# Patient Record
Sex: Female | Born: 1990
Health system: Southern US, Community
[De-identification: ages and names within clinical notes are randomized; demographics above are authoritative.]

## PROBLEM LIST (undated history)

## (undated) DIAGNOSIS — Z87442 Personal history of urinary calculi: Secondary | ICD-10-CM

## (undated) HISTORY — PX: OTHER SURGICAL HISTORY: SHX169

## (undated) HISTORY — DX: Personal history of urinary calculi: Z87.442

---

## 2009-04-11 DIAGNOSIS — Z87442 Personal history of urinary calculi: Secondary | ICD-10-CM

## 2009-04-11 HISTORY — DX: Personal history of urinary calculi: Z87.442

## 2013-05-30 ENCOUNTER — Ambulatory Visit (INDEPENDENT_AMBULATORY_CARE_PROVIDER_SITE_OTHER): Payer: 59 | Admitting: Nurse Practitioner

## 2013-05-30 ENCOUNTER — Encounter: Payer: Self-pay | Admitting: Nurse Practitioner

## 2013-05-30 VITALS — BP 134/82 | HR 80 | Ht 69.0 in | Wt 166.0 lb

## 2013-05-30 DIAGNOSIS — Z0000 Encounter for general adult medical examination without abnormal findings: Secondary | ICD-10-CM

## 2013-05-30 DIAGNOSIS — Z113 Encounter for screening for infections with a predominantly sexual mode of transmission: Secondary | ICD-10-CM

## 2013-05-30 DIAGNOSIS — Z01419 Encounter for gynecological examination (general) (routine) without abnormal findings: Secondary | ICD-10-CM

## 2013-05-30 MED ORDER — NORETHIN ACE-ETH ESTRAD-FE 1.5-30 MG-MCG PO TABS: 1.0000 | ORAL_TABLET | Freq: Every day | ORAL | Status: DC

## 2013-05-30 NOTE — Patient Instructions (Signed)
General topics  Next pap or exam is  due in 1 year Take a Women's multivitamin Take 1200 mg. of calcium daily - prefer dietary If any concerns in interim to call back  Breast Self-Awareness Practicing breast self-awareness may pick up problems early, prevent significant medical complications, and possibly save your life. By practicing breast self-awareness, you can become familiar with how your breasts look and feel and if your breasts are changing. This allows you to notice changes early. It can also offer you some reassurance that your breast health is good. One way to learn what is normal for your breasts and whether your breasts are changing is to do a breast self-exam. If you find a lump or something that was not present in the past, it is best to contact your caregiver right away. Other findings that should be evaluated by your caregiver include nipple discharge, especially if it is bloody; skin changes or reddening; areas where the skin seems to be pulled in (retracted); or new lumps and bumps. Breast pain is seldom associated with cancer (malignancy), but should also be evaluated by a caregiver. BREAST SELF-EXAM The best time to examine your breasts is 5 7 days after your menstrual period is over.  ExitCare Patient Information 2013 ExitCare, LLC.   Exercise to Stay Healthy Exercise helps you become and stay healthy. EXERCISE IDEAS AND TIPS Choose exercises that:  You enjoy.  Fit into your day. You do not need to exercise really hard to be healthy. You can do exercises at a slow or medium level and stay healthy. You can:  Stretch before and after working out.  Try yoga, Pilates, or tai chi.  Lift weights.  Walk fast, swim, jog, run, climb stairs, bicycle, dance, or rollerskate.  Take aerobic classes. Exercises that burn about 150 calories:  Running 1  miles in 15 minutes.  Playing volleyball for 45 to 60 minutes.  Washing and waxing a car for 45 to 60  minutes.  Playing touch football for 45 minutes.  Walking 1  miles in 35 minutes.  Pushing a stroller 1  miles in 30 minutes.  Playing basketball for 30 minutes.  Raking leaves for 30 minutes.  Bicycling 5 miles in 30 minutes.  Walking 2 miles in 30 minutes.  Dancing for 30 minutes.  Shoveling snow for 15 minutes.  Swimming laps for 20 minutes.  Walking up stairs for 15 minutes.  Bicycling 4 miles in 15 minutes.  Gardening for 30 to 45 minutes.  Jumping rope for 15 minutes.  Washing windows or floors for 45 to 60 minutes. Document Released: 04/30/2010 Document Revised: 06/20/2011 Document Reviewed: 04/30/2010 ExitCare Patient Information 2013 ExitCare[employer] LLC.   Other topics ( that may be useful information):    Sexually Transmitted Disease Sexually transmitted disease (STD) refers to any infection that is passed from person to person during sexual activity. This may happen by way of saliva, semen, blood, vaginal mucus, or urine. Common STDs include:  Gonorrhea.  Chlamydia.  Syphilis.  HIV/AIDS.  Genital herpes.  Hepatitis B and C.  Trichomonas.  Human papillomavirus (HPV).  Pubic lice. CAUSES  An STD may be spread by bacteria, virus, or parasite. A person can get an STD by:  Sexual intercourse with an infected person.  Sharing sex toys with an infected person.  Sharing needles with an infected person.  Having intimate contact with the genitals, mouth, or rectal areas of an infected person. SYMPTOMS  Some people may not have any symptoms, but   they can still pass the infection to others. Different STDs have different symptoms. Symptoms include:  Painful or bloody urination.  Pain in the pelvis, abdomen, vagina, anus, throat, or eyes.  Skin rash, itching, irritation, growths, or sores (lesions). These usually occur in the genital or anal area.  Abnormal vaginal discharge.  Penile discharge in men.  Soft, flesh-colored skin growths in the  genital or anal area.  Fever.  Pain or bleeding during sexual intercourse.  Swollen glands in the groin area.  Yellow skin and eyes (jaundice). This is seen with hepatitis. DIAGNOSIS  To make a diagnosis, your caregiver may:  Take a medical history.  Perform a physical exam.  Take a specimen (culture) to be examined.  Examine a sample of discharge under a microscope.  Perform blood test TREATMENT   Chlamydia, gonorrhea, trichomonas, and syphilis can be cured with antibiotic medicine.  Genital herpes, hepatitis, and HIV can be treated, but not cured, with prescribed medicines. The medicines will lessen the symptoms.  Genital warts from HPV can be treated with medicine or by freezing, burning (electrocautery), or surgery. Warts may come back.  HPV is a virus and cannot be cured with medicine or surgery.However, abnormal areas may be followed very closely by your caregiver and may be removed from the cervix, vagina, or vulva through office procedures or surgery. If your diagnosis is confirmed, your recent sexual partners need treatment. This is true even if they are symptom-free or have a negative culture or evaluation. They should not have sex until their caregiver says it is okay. HOME CARE INSTRUCTIONS  All sexual partners should be informed, tested, and treated for all STDs.  Take your antibiotics as directed. Finish them even if you start to feel better.  Only take over-the-counter or prescription medicines for pain, discomfort, or fever as directed by your caregiver.  Rest.  Eat a balanced diet and drink enough fluids to keep your urine clear or pale yellow.  Do not have sex until treatment is completed and you have followed up with your caregiver. STDs should be checked after treatment.  Keep all follow-up appointments, Pap tests, and blood tests as directed by your caregiver.  Only use latex condoms and water-soluble lubricants during sexual activity. Do not use  petroleum jelly or oils.  Avoid alcohol and illegal drugs.  Get vaccinated for HPV and hepatitis. If you have not received these vaccines in the past, talk to your caregiver about whether one or both might be right for you.  Avoid risky sex practices that can break the skin. The only way to avoid getting an STD is to avoid all sexual activity.Latex condoms and dental dams (for oral sex) will help lessen the risk of getting an STD, but will not completely eliminate the risk. SEEK MEDICAL CARE IF:   You have a fever.  You have any new or worsening symptoms. Document Released: 06/18/2002 Document Revised: 06/20/2011 Document Reviewed: 06/25/2010 [facility] Patient Information 2013 [employer].    Domestic Abuse You are being battered or abused if someone close to you hits, pushes, or physically hurts you in any way. You also are being abused if you are forced into activities. You are being sexually abused if you are forced to have sexual contact of any kind. You are being emotionally abused if you are made to feel worthless or if you are constantly threatened. It is important to remember that help is available. No one has the right to abuse you. PREVENTION OF FURTHER  ABUSE  Learn the warning signs of danger. This varies with situations but may include: the use of alcohol, threats, isolation from friends and family, or forced sexual contact. Leave if you feel that violence is going to occur.  If you are attacked or beaten, report it to the police so the abuse is documented. You do not have to press charges. The police can protect you while you or the attackers are leaving. Get the officer's name and badge number and a copy of the report.  Find someone you can trust and tell them what is happening to you: your caregiver, a nurse, clergy member, close friend or family member. Feeling ashamed is natural, but remember that you have done nothing wrong. No one deserves abuse. Document Released:  03/25/2000 Document Revised: 06/20/2011 Document Reviewed: 06/03/2010 ExitCare Patient Information 2013 ExitCare[employer] LLC.    How Much is Too Much Alcohol? Drinking too much alcohol can cause injury, accidents, and health problems. These types of problems can include:   Car crashes.  Falls.  Family fighting (domestic violence).  Drowning.  Fights.  Injuries.  Burns.  Damage to certain organs.  Having a baby with birth defects. ONE DRINK CAN BE TOO MUCH WHEN YOU ARE:  Working.  Pregnant or breastfeeding.  Taking medicines. Ask your doctor.  Driving or planning to drive. If you or someone you know has a drinking problem, get help from a doctor.  Document Released: 01/22/2009 Document Revised: 06/20/2011 Document Reviewed: 01/22/2009 ExitCare Patient Information 2013 ExitCare[employer].   Smoking Hazards Smoking cigarettes is extremely bad for your health. Tobacco smoke has over 200 known poisons in it. There are over 60 chemicals in tobacco smoke that cause cancer. Some of the chemicals found in cigarette smoke include:   Cyanide.  Benzene.  Formaldehyde.  Methanol (wood alcohol).  Acetylene (fuel used in welding torches).  Ammonia. Cigarette smoke also contains the poisonous gases nitrogen oxide and carbon monoxide.  Cigarette smokers have an increased risk of many serious medical problems and Smoking causes approximately:  90% of all lung cancer deaths in men.  80% of all lung cancer deaths in women.  90% of deaths from chronic obstructive lung disease. Compared with nonsmokers, smoking increases the risk of:  Coronary heart disease by 2 to 4 times.  Stroke by 2 to 4 times.  Men developing lung cancer by 23 times.  Women developing lung cancer by 13 times.  Dying from chronic obstructive lung diseases by 12 times.  . Smoking is the most preventable cause of death and disease in our society.  WHY IS SMOKING ADDICTIVE?  Nicotine is the chemical  agent in tobacco that is capable of causing addiction or dependence.  When you smoke and inhale, nicotine is absorbed rapidly into the bloodstream through your lungs. Nicotine absorbed through the lungs is capable of creating a powerful addiction. Both inhaled and non-inhaled nicotine may be addictive.  Addiction studies of cigarettes and spit tobacco show that addiction to nicotine occurs mainly during the teen years, when young people begin using tobacco products. WHAT ARE THE BENEFITS OF QUITTING?  There are many health benefits to quitting smoking.   Likelihood of developing cancer and heart disease decreases. Health improvements are seen almost immediately.  Blood pressure, pulse rate, and breathing patterns start returning to normal soon after quitting. QUITTING SMOKING   American Lung Association - 1-800-LUNGUSA  American Cancer Society - 1-800-ACS-2345 Document Released: 05/05/2004 Document Revised: 06/20/2011 Document Reviewed: 01/07/2009 ExitCare Patient Information 2013 ExitCare,   LLC.   Stress Management Stress is a state of physical or mental tension that often results from changes in your life or normal routine. Some common causes of stress are:  Death of a loved one.  Injuries or severe illnesses.  Getting fired or changing jobs.  Moving into a new home. Other causes may be:  Sexual problems.  Business or financial losses.  Taking on a large debt.  Regular conflict with someone at home or at work.  Constant tiredness from lack of sleep. It is not just bad things that are stressful. It may be stressful to:  Win the lottery.  Get married.  Buy a new car. The amount of stress that can be easily tolerated varies from person to person. Changes generally cause stress, regardless of the types of change. Too much stress can affect your health. It may lead to physical or emotional problems. Too little stress (boredom) may also become stressful. SUGGESTIONS TO  REDUCE STRESS:  Talk things over with your family and friends. It often is helpful to share your concerns and worries. If you feel your problem is serious, you may want to get help from a professional counselor.  Consider your problems one at a time instead of lumping them all together. Trying to take care of everything at once may seem impossible. List all the things you need to do and then start with the most important one. Set a goal to accomplish 2 or 3 things each day. If you expect to do too many in a single day you will naturally fail, causing you to feel even more stressed.  Do not use alcohol or drugs to relieve stress. Although you may feel better for a short time, they do not remove the problems that caused the stress. They can also be habit forming.  Exercise regularly - at least 3 times per week. Physical exercise can help to relieve that "uptight" feeling and will relax you.  The shortest distance between despair and hope is often a good night's sleep.  Go to bed and get up on time allowing yourself time for appointments without being rushed.  Take a short "time-out" period from any stressful situation that occurs during the day. Close your eyes and take some deep breaths. Starting with the muscles in your face, tense them, hold it for a few seconds, then relax. Repeat this with the muscles in your neck, shoulders, hand, stomach, back and legs.  Take good care of yourself. Eat a balanced diet and get plenty of rest.  Schedule time for having fun. Take a break from your daily routine to relax. HOME CARE INSTRUCTIONS   Call if you feel overwhelmed by your problems and feel you can no longer manage them on your own.  Return immediately if you feel like hurting yourself or someone else. Document Released: 09/21/2000 Document Revised: 06/20/2011 Document Reviewed: 05/14/2007 ExitCare Patient Information 2013 ExitCare, LLC.  

## 2013-05-30 NOTE — Progress Notes (Signed)
Patient ID: Jean Burke, female   DOB: 18-Oct-1990, 23 y.o.   MRN: [id number] 23 y.o. G0P0 Single Caucasian Fe here for annual exam.  Menses lasting 3-4 days, flow moderate to light.  No PMS, no cramps.  Same partner for 6 months.  No pelvic pain or discomfort.  Acne better on OCP. Traveled to [place] and [place] school related last year.  Works in [employer] at [employer].   Patient's last menstrual period was 05/13/2013.          Sexually active: yes  The current method of family planning is OCP (estrogen/progesterone) and condoms all of the time.    Exercising: yes  Gym/ health club routine includes walking on track  and personal trainer 3 times per week and work on machines and free weights 2 times per week. Smoker:  no  Health Maintenance: Pap:  1.5 years ago, normal, no history of abnormal TDaP:  UTD Gardasil: completed in 03-Jul-2005 MMR II is current Labs: declined (needle Phobia)   reports that she has never smoked. She has never used smokeless tobacco. She reports that she drinks alcohol. She reports that she does not use illicit drugs.  Past Medical History  Diagnosis Date  . H/O renal calculi Jul 03, 2009    right side and passed on her own    History reviewed. No pertinent past surgical history.  Current Outpatient Prescriptions  Medication Sig Dispense Refill  . Multiple Vitamin (MULTIVITAMIN) tablet Take 1 tablet by mouth daily.      . norethindrone-ethinyl estradiol-iron (MICROGESTIN FE,GILDESS FE,LOESTRIN FE) 1.5-30 MG-MCG tablet Take 1 tablet by mouth daily.  3 Package  3  . Omega-3 Fatty Acids (FISH OIL) 1000 MG CAPS Take 2 capsules by mouth daily.      . Probiotic Product (PROBIOTIC PO) Take by mouth.       No current facility-administered medications for this visit.    Family History  Problem Relation Age of Onset  . Cancer Paternal Grandmother     thyroid    ROS:  Pertinent items are noted in HPI.  Otherwise, a comprehensive ROS was negative.  Exam:   BP 134/82  Pulse 80   Ht 5\' 9"  (1.753 m)  Wt 166 lb (75.297 kg)  BMI 24.50 kg/m2  LMP 05/13/2013 Height: 5\' 9"  (175.3 cm)  Ht Readings from Last 3 Encounters:  05/30/13 5\' 9"  (1.753 m)    General appearance: alert, cooperative and appears stated age Head: Normocephalic, without obvious abnormality, atraumatic Neck: no adenopathy, supple, symmetrical, trachea midline and thyroid normal to inspection and palpation Lungs: clear to auscultation bilaterally Breasts: normal appearance, no masses or tenderness Heart: regular rate and rhythm Abdomen: soft, non-tender; no masses,  no organomegaly Extremities: extremities normal, atraumatic, no cyanosis or edema Skin: Skin color, texture, turgor normal. No rashes or lesions Lymph nodes: Cervical, supraclavicular, and axillary nodes normal. No abnormal inguinal nodes palpated Neurologic: Grossly normal   Pelvic: External genitalia:  no lesions              Urethra:  normal appearing urethra with no masses, tenderness or lesions              Bartholin's and Skene's: normal                 Vagina: normal appearing vagina with normal color and discharge, no lesions              Cervix: anteverted  Pap taken: yes Bimanual Exam:  Uterus:  normal size, contour, position, consistency, mobility, non-tender              Adnexa: no mass, fullness, tenderness               Rectovaginal: Confirms               Anus:  normal sphincter tone, no lesions  A:  Well Woman with normal exam  Contraception needs  R/O STD's (declines blood work)  P:   Pap smear as per guidelines Done today  Refill on Microgestin 1.5/30 for a year  Will follow with pap and GC/ Chl  Counseled on breast self exam, STD prevention, safety of OCP, adequate intake of calcium and vitamin D, diet and exercise return annually or prn  An After Visit Summary was printed and given to the patient.

## 2013-05-31 LAB — IPS PAP TEST WITH REFLEX TO HPV

## 2013-06-02 NOTE — Progress Notes (Signed)
Encounter reviewed by Dr. Brook Silva.  

## 2013-06-03 LAB — IPS N GONORRHOEA AND CHLAMYDIA BY PCR

## 2013-06-04 ENCOUNTER — Telehealth: Payer: Self-pay | Admitting: *Deleted

## 2013-06-04 NOTE — Telephone Encounter (Signed)
Pt notified in result note.  

## 2013-06-04 NOTE — Telephone Encounter (Signed)
I have attempted to contact this patient by phone with the following results: left message to return my call on answering machine (home per ROI).  

## 2013-06-04 NOTE — Telephone Encounter (Signed)
Message copied by Luisa DagoPHILLIPS, STEPHANIE C on Tue Jun 04, 2013  1:06 PM ------      Message from: Ria CommentGRUBB, PATRICIA R      Created: Mon Jun 03, 2013 10:25 AM       Let patient now test results. ------

## 2014-05-06 ENCOUNTER — Other Ambulatory Visit: Payer: Self-pay | Admitting: Nurse Practitioner

## 2014-05-06 NOTE — Telephone Encounter (Signed)
Medication refill request: Microgestin  Last AEX: 05/2013 Next AEX: 06/05/14 Last MMG (if hormonal medication request): N/A Refill authorized: 05/30/13 #3packs/3R. Today #1pack/0R?

## 2014-06-03 ENCOUNTER — Other Ambulatory Visit: Payer: Self-pay | Admitting: Nurse Practitioner

## 2014-06-03 NOTE — Telephone Encounter (Signed)
Medication refill request: Junel 1.5/30 Last AEX:  05/30/13 with PG  Next AEX: 06/05/14 with PG  Last MMG (if hormonal medication request): N/A Refill authorized: #28/0 rfs, please advise.

## 2014-06-05 ENCOUNTER — Ambulatory Visit: Payer: 59 | Admitting: Nurse Practitioner

## 2014-06-05 ENCOUNTER — Encounter: Payer: Self-pay | Admitting: Nurse Practitioner

## 2014-06-14 ENCOUNTER — Other Ambulatory Visit: Payer: Self-pay | Admitting: Nurse Practitioner

## 2014-06-16 ENCOUNTER — Telehealth: Payer: Self-pay | Admitting: Nurse Practitioner

## 2014-06-16 MED ORDER — NORETHIN ACE-ETH ESTRAD-FE 1.5-30 MG-MCG PO TABS: 1.0000 | ORAL_TABLET | Freq: Every day | ORAL | Status: DC

## 2014-06-16 NOTE — Telephone Encounter (Signed)
Medication refill request: Junel 1.5/30 Last AEX: 05/30/13 with PG  Next AEX: 06/27/14 with PG Last MMG (if hormonal medication request): N/A Refill authorized: #28/0 rfs, please advise.  Ms. Alexia Freestoneatty patient is scheduled for AEX with you. If approved I will talk with patient about importance of coming in for AEX.

## 2014-06-16 NOTE — Telephone Encounter (Signed)
Medication refill request: Junel 1.5/30 Last AEX:  05/30/13 with PG  Next AEX: No AEX scheduled  Last MMG (if hormonal medication request): N/A Refill authorized: Please advise.  Patient no showed last appt scheduled for 06/05/14 with PG, please approve/deny rx.

## 2014-06-16 NOTE — Telephone Encounter (Signed)
06/16/14 #28/0 rfs was sent to [facility], patient is aware and advised to keep 06/27/14 appointment with Ms. Patty

## 2014-06-16 NOTE — Telephone Encounter (Signed)
Patient is requesting a refill for her birth control. She is scheduled for 06/27/14 @3 :45 with PG Patient states she is going out of town and [facility] [address] [facility]

## 2014-06-16 NOTE — Telephone Encounter (Signed)
Pt called to check status on this. Pt states she is afraid if not done soon, she will not be able to pick up before leaving area tomorrow.

## 2014-06-27 ENCOUNTER — Encounter: Payer: Self-pay | Admitting: Nurse Practitioner

## 2014-06-27 ENCOUNTER — Ambulatory Visit (INDEPENDENT_AMBULATORY_CARE_PROVIDER_SITE_OTHER): Payer: 59 | Admitting: Nurse Practitioner

## 2014-06-27 VITALS — BP 124/80 | HR 84 | Resp 18 | Ht 69.25 in | Wt 172.0 lb

## 2014-06-27 DIAGNOSIS — Z0000 Encounter for general adult medical examination without abnormal findings: Secondary | ICD-10-CM | POA: Diagnosis not present

## 2014-06-27 DIAGNOSIS — Z01419 Encounter for gynecological examination (general) (routine) without abnormal findings: Secondary | ICD-10-CM

## 2014-06-27 DIAGNOSIS — Z113 Encounter for screening for infections with a predominantly sexual mode of transmission: Secondary | ICD-10-CM | POA: Diagnosis not present

## 2014-06-27 DIAGNOSIS — R319 Hematuria, unspecified: Secondary | ICD-10-CM | POA: Diagnosis not present

## 2014-06-27 LAB — POCT URINALYSIS DIPSTICK
Bilirubin, UA: NEGATIVE
Glucose, UA: NEGATIVE
Ketones, UA: NEGATIVE
Leukocytes, UA: NEGATIVE
Nitrite, UA: NEGATIVE
Protein, UA: NEGATIVE
UROBILINOGEN UA: NEGATIVE
pH, UA: 5

## 2014-06-27 MED ORDER — NORETHIN ACE-ETH ESTRAD-FE 1.5-30 MG-MCG PO TABS: 1.0000 | ORAL_TABLET | Freq: Every day | ORAL | Status: DC

## 2014-06-27 NOTE — Patient Instructions (Signed)
General topics  Next pap or exam is  due in 1 year Take a Women's multivitamin Take 1200 mg. of calcium daily - prefer dietary If any concerns in interim to call back  Breast Self-Awareness Practicing breast self-awareness may pick up problems early, prevent significant medical complications, and possibly save your life. By practicing breast self-awareness, you can become familiar with how your breasts look and feel and if your breasts are changing. This allows you to notice changes early. It can also offer you some reassurance that your breast health is good. One way to learn what is normal for your breasts and whether your breasts are changing is to do a breast self-exam. If you find a lump or something that was not present in the past, it is best to contact your caregiver right away. Other findings that should be evaluated by your caregiver include nipple discharge, especially if it is bloody; skin changes or reddening; areas where the skin seems to be pulled in (retracted); or new lumps and bumps. Breast pain is seldom associated with cancer (malignancy), but should also be evaluated by a caregiver. BREAST SELF-EXAM The best time to examine your breasts is 5 7 days after your menstrual period is over.  ExitCare Patient Information 2013 ExitCare, LLC.   Exercise to Stay Healthy Exercise helps you become and stay healthy. EXERCISE IDEAS AND TIPS Choose exercises that:  You enjoy.  Fit into your day. You do not need to exercise really hard to be healthy. You can do exercises at a slow or medium level and stay healthy. You can:  Stretch before and after working out.  Try yoga, Pilates, or tai chi.  Lift weights.  Walk fast, swim, jog, run, climb stairs, bicycle, dance, or rollerskate.  Take aerobic classes. Exercises that burn about 150 calories:  Running 1  miles in 15 minutes.  Playing volleyball for 45 to 60 minutes.  Washing and waxing a car for 45 to 60  minutes.  Playing touch football for 45 minutes.  Walking 1  miles in 35 minutes.  Pushing a stroller 1  miles in 30 minutes.  Playing basketball for 30 minutes.  Raking leaves for 30 minutes.  Bicycling 5 miles in 30 minutes.  Walking 2 miles in 30 minutes.  Dancing for 30 minutes.  Shoveling snow for 15 minutes.  Swimming laps for 20 minutes.  Walking up stairs for 15 minutes.  Bicycling 4 miles in 15 minutes.  Gardening for 30 to 45 minutes.  Jumping rope for 15 minutes.  Washing windows or floors for 45 to 60 minutes. Document Released: 04/30/2010 Document Revised: 06/20/2011 Document Reviewed: 04/30/2010 ExitCare Patient Information 2013 ExitCare[employer] LLC.   Other topics ( that may be useful information):    Sexually Transmitted Disease Sexually transmitted disease (STD) refers to any infection that is passed from person to person during sexual activity. This may happen by way of saliva, semen, blood, vaginal mucus, or urine. Common STDs include:  Gonorrhea.  Chlamydia.  Syphilis.  HIV/AIDS.  Genital herpes.  Hepatitis B and C.  Trichomonas.  Human papillomavirus (HPV).  Pubic lice. CAUSES  An STD may be spread by bacteria, virus, or parasite. A person can get an STD by:  Sexual intercourse with an infected person.  Sharing sex toys with an infected person.  Sharing needles with an infected person.  Having intimate contact with the genitals, mouth, or rectal areas of an infected person. SYMPTOMS  Some people may not have any symptoms, but   they can still pass the infection to others. Different STDs have different symptoms. Symptoms include:  Painful or bloody urination.  Pain in the pelvis, abdomen, vagina, anus, throat, or eyes.  Skin rash, itching, irritation, growths, or sores (lesions). These usually occur in the genital or anal area.  Abnormal vaginal discharge.  Penile discharge in men.  Soft, flesh-colored skin growths in the  genital or anal area.  Fever.  Pain or bleeding during sexual intercourse.  Swollen glands in the groin area.  Yellow skin and eyes (jaundice). This is seen with hepatitis. DIAGNOSIS  To make a diagnosis, your caregiver may:  Take a medical history.  Perform a physical exam.  Take a specimen (culture) to be examined.  Examine a sample of discharge under a microscope.  Perform blood test TREATMENT   Chlamydia, gonorrhea, trichomonas, and syphilis can be cured with antibiotic medicine.  Genital herpes, hepatitis, and HIV can be treated, but not cured, with prescribed medicines. The medicines will lessen the symptoms.  Genital warts from HPV can be treated with medicine or by freezing, burning (electrocautery), or surgery. Warts may come back.  HPV is a virus and cannot be cured with medicine or surgery.However, abnormal areas may be followed very closely by your caregiver and may be removed from the cervix, vagina, or vulva through office procedures or surgery. If your diagnosis is confirmed, your recent sexual partners need treatment. This is true even if they are symptom-free or have a negative culture or evaluation. They should not have sex until their caregiver says it is okay. HOME CARE INSTRUCTIONS  All sexual partners should be informed, tested, and treated for all STDs.  Take your antibiotics as directed. Finish them even if you start to feel better.  Only take over-the-counter or prescription medicines for pain, discomfort, or fever as directed by your caregiver.  Rest.  Eat a balanced diet and drink enough fluids to keep your urine clear or pale yellow.  Do not have sex until treatment is completed and you have followed up with your caregiver. STDs should be checked after treatment.  Keep all follow-up appointments, Pap tests, and blood tests as directed by your caregiver.  Only use latex condoms and water-soluble lubricants during sexual activity. Do not use  petroleum jelly or oils.  Avoid alcohol and illegal drugs.  Get vaccinated for HPV and hepatitis. If you have not received these vaccines in the past, talk to your caregiver about whether one or both might be right for you.  Avoid risky sex practices that can break the skin. The only way to avoid getting an STD is to avoid all sexual activity.Latex condoms and dental dams (for oral sex) will help lessen the risk of getting an STD, but will not completely eliminate the risk. SEEK MEDICAL CARE IF:   You have a fever.  You have any new or worsening symptoms. Document Released: 06/18/2002 Document Revised: 06/20/2011 Document Reviewed: 06/25/2010 [facility] Patient Information 2013 [employer].    Domestic Abuse You are being battered or abused if someone close to you hits, pushes, or physically hurts you in any way. You also are being abused if you are forced into activities. You are being sexually abused if you are forced to have sexual contact of any kind. You are being emotionally abused if you are made to feel worthless or if you are constantly threatened. It is important to remember that help is available. No one has the right to abuse you. PREVENTION OF FURTHER  ABUSE  Learn the warning signs of danger. This varies with situations but may include: the use of alcohol, threats, isolation from friends and family, or forced sexual contact. Leave if you feel that violence is going to occur.  If you are attacked or beaten, report it to the police so the abuse is documented. You do not have to press charges. The police can protect you while you or the attackers are leaving. Get the officer's name and badge number and a copy of the report.  Find someone you can trust and tell them what is happening to you: your caregiver, a nurse, clergy member, close friend or family member. Feeling ashamed is natural, but remember that you have done nothing wrong. No one deserves abuse. Document Released:  03/25/2000 Document Revised: 06/20/2011 Document Reviewed: 06/03/2010 ExitCare Patient Information 2013 ExitCare[employer] LLC.    How Much is Too Much Alcohol? Drinking too much alcohol can cause injury, accidents, and health problems. These types of problems can include:   Car crashes.  Falls.  Family fighting (domestic violence).  Drowning.  Fights.  Injuries.  Burns.  Damage to certain organs.  Having a baby with birth defects. ONE DRINK CAN BE TOO MUCH WHEN YOU ARE:  Working.  Pregnant or breastfeeding.  Taking medicines. Ask your doctor.  Driving or planning to drive. If you or someone you know has a drinking problem, get help from a doctor.  Document Released: 01/22/2009 Document Revised: 06/20/2011 Document Reviewed: 01/22/2009 ExitCare Patient Information 2013 ExitCare[employer].   Smoking Hazards Smoking cigarettes is extremely bad for your health. Tobacco smoke has over 200 known poisons in it. There are over 60 chemicals in tobacco smoke that cause cancer. Some of the chemicals found in cigarette smoke include:   Cyanide.  Benzene.  Formaldehyde.  Methanol (wood alcohol).  Acetylene (fuel used in welding torches).  Ammonia. Cigarette smoke also contains the poisonous gases nitrogen oxide and carbon monoxide.  Cigarette smokers have an increased risk of many serious medical problems and Smoking causes approximately:  90% of all lung cancer deaths in men.  80% of all lung cancer deaths in women.  90% of deaths from chronic obstructive lung disease. Compared with nonsmokers, smoking increases the risk of:  Coronary heart disease by 2 to 4 times.  Stroke by 2 to 4 times.  Men developing lung cancer by 23 times.  Women developing lung cancer by 13 times.  Dying from chronic obstructive lung diseases by 12 times.  . Smoking is the most preventable cause of death and disease in our society.  WHY IS SMOKING ADDICTIVE?  Nicotine is the chemical  agent in tobacco that is capable of causing addiction or dependence.  When you smoke and inhale, nicotine is absorbed rapidly into the bloodstream through your lungs. Nicotine absorbed through the lungs is capable of creating a powerful addiction. Both inhaled and non-inhaled nicotine may be addictive.  Addiction studies of cigarettes and spit tobacco show that addiction to nicotine occurs mainly during the teen years, when young people begin using tobacco products. WHAT ARE THE BENEFITS OF QUITTING?  There are many health benefits to quitting smoking.   Likelihood of developing cancer and heart disease decreases. Health improvements are seen almost immediately.  Blood pressure, pulse rate, and breathing patterns start returning to normal soon after quitting. QUITTING SMOKING   American Lung Association - 1-800-LUNGUSA  American Cancer Society - 1-800-ACS-2345 Document Released: 05/05/2004 Document Revised: 06/20/2011 Document Reviewed: 01/07/2009 ExitCare Patient Information 2013 ExitCare,   LLC.   Stress Management Stress is a state of physical or mental tension that often results from changes in your life or normal routine. Some common causes of stress are:  Death of a loved one.  Injuries or severe illnesses.  Getting fired or changing jobs.  Moving into a new home. Other causes may be:  Sexual problems.  Business or financial losses.  Taking on a large debt.  Regular conflict with someone at home or at work.  Constant tiredness from lack of sleep. It is not just bad things that are stressful. It may be stressful to:  Win the lottery.  Get married.  Buy a new car. The amount of stress that can be easily tolerated varies from person to person. Changes generally cause stress, regardless of the types of change. Too much stress can affect your health. It may lead to physical or emotional problems. Too little stress (boredom) may also become stressful. SUGGESTIONS TO  REDUCE STRESS:  Talk things over with your family and friends. It often is helpful to share your concerns and worries. If you feel your problem is serious, you may want to get help from a professional counselor.  Consider your problems one at a time instead of lumping them all together. Trying to take care of everything at once may seem impossible. List all the things you need to do and then start with the most important one. Set a goal to accomplish 2 or 3 things each day. If you expect to do too many in a single day you will naturally fail, causing you to feel even more stressed.  Do not use alcohol or drugs to relieve stress. Although you may feel better for a short time, they do not remove the problems that caused the stress. They can also be habit forming.  Exercise regularly - at least 3 times per week. Physical exercise can help to relieve that "uptight" feeling and will relax you.  The shortest distance between despair and hope is often a good night's sleep.  Go to bed and get up on time allowing yourself time for appointments without being rushed.  Take a short "time-out" period from any stressful situation that occurs during the day. Close your eyes and take some deep breaths. Starting with the muscles in your face, tense them, hold it for a few seconds, then relax. Repeat this with the muscles in your neck, shoulders, hand, stomach, back and legs.  Take good care of yourself. Eat a balanced diet and get plenty of rest.  Schedule time for having fun. Take a break from your daily routine to relax. HOME CARE INSTRUCTIONS   Call if you feel overwhelmed by your problems and feel you can no longer manage them on your own.  Return immediately if you feel like hurting yourself or someone else. Document Released: 09/21/2000 Document Revised: 06/20/2011 Document Reviewed: 05/14/2007 ExitCare Patient Information 2013 ExitCare, LLC.  

## 2014-06-27 NOTE — Progress Notes (Signed)
24 y.o. G0P0 Single  Caucasian Fe here for annual exam.  Menses now at 3-4 days.  Flow is light.  No cramps.  New partner for 5 months. Last SA 2 days ago.  Patient's last menstrual period was 06/09/2014.          Sexually active: Yes.    The current method of family planning is OCP (estrogen/progesterone).    Exercising: Yes.    gym, Running Smoker:  no  Health Maintenance: Pap: 05/30/13 Neg Self Breast Check: No Gardasil completed Sep 06, 2005 TDaP: <10 years  Labs: Declined  UA: RBC=MOD - asymptomatic   reports that she has never smoked. She has never used smokeless tobacco. She reports that she drinks alcohol. She reports that she does not use illicit drugs.  Past Medical History  Diagnosis Date  . H/O renal calculi 09/06/2009    right side and passed on her own    History reviewed. No pertinent past surgical history.  Current Outpatient Prescriptions  Medication Sig Dispense Refill  . Multiple Vitamin (MULTIVITAMIN) tablet Take 1 tablet by mouth daily.    . norethindrone-ethinyl estradiol-iron (JUNEL FE 1.5/30) 1.5-30 MG-MCG tablet Take 1 tablet by mouth daily. 3 Package 3  . Omega-3 Fatty Acids (FISH OIL) 1000 MG CAPS Take 2 capsules by mouth daily.    . Probiotic Product (PROBIOTIC PO) Take by mouth.     No current facility-administered medications for this visit.    Family History  Problem Relation Age of Onset  . Cancer Paternal Grandmother     thyroid    ROS:  Pertinent items are noted in HPI.  Otherwise, a comprehensive ROS was negative.  Exam:   BP 124/80 mmHg  Pulse 84  Resp 18  Ht 5' 9.25" (1.759 m)  Wt 172 lb (78.019 kg)  BMI 25.22 kg/m2  LMP 06/09/2014 Height: 5' 9.25" (175.9 cm) Ht Readings from Last 3 Encounters:  06/27/14 5' 9.25" (1.759 m)  05/30/13  (1.753 m)    General appearance: alert, cooperative and appears stated age Head: Normocephalic, without obvious abnormality, atraumatic Neck: no adenopathy, supple, symmetrical, trachea midline and  thyroid normal to inspection and palpation Lungs: clear to auscultation bilaterally Breasts: normal appearance, no masses or tenderness Heart: regular rate and rhythm Abdomen: soft, non-tender; no masses,  no organomegaly Extremities: extremities normal, atraumatic, no cyanosis or edema Skin: Skin color, texture, turgor normal. No rashes or lesions Lymph nodes: Cervical, supraclavicular, and axillary nodes normal. No abnormal inguinal nodes palpated Neurologic: Grossly normal   Pelvic: External genitalia:  no lesions, there is a small abrasive area at 6:00 position that is red and raw - most likely the source of RBC's in urine              Urethra:  normal appearing urethra with no masses, tenderness or lesions              Bartholin's and Skene's: normal                 Vagina: normal appearing vagina with normal color and discharge, no lesions              Cervix: anteverted              Pap taken: Yes.   Bimanual Exam:  Uterus:  normal size, contour, position, consistency, mobility, non-tender              Adnexa: no mass, fullness, tenderness  Rectovaginal: Confirms               Anus:  normal sphincter tone, no lesions  Chaperone present: Yes  A:  Well Woman with normal exam  Contraception needs  History of hematuria, R/O UTI R/O STD's (declines blood work)    P:   Reviewed health and wellness pertinent to exam  Pap smear taken today  Refill on OCP Loestrin 1.5/30 for a year  Follow up with GC and Chl  Counseled on breast self exam, STD prevention, HIV risk factors and prevention, use and side effects of OCP's, adequate intake of calcium and vitamin D, diet and exercise return annually or prn  An After Visit Summary was printed and given to the patient.

## 2014-06-28 LAB — URINALYSIS, MICROSCOPIC ONLY
BACTERIA UA: NONE SEEN
CASTS: NONE SEEN
Crystals: NONE SEEN
Squamous Epithelial / LPF: NONE SEEN

## 2014-06-29 LAB — URINE CULTURE

## 2014-07-01 LAB — IPS N GONORRHOEA AND CHLAMYDIA BY PCR

## 2014-07-01 LAB — IPS PAP TEST WITH REFLEX TO HPV

## 2014-07-03 NOTE — Progress Notes (Signed)
Encounter reviewed by Dr. Brook Silva.  

## 2014-12-09 ENCOUNTER — Encounter: Payer: Self-pay | Admitting: Nurse Practitioner

## 2014-12-09 ENCOUNTER — Ambulatory Visit (INDEPENDENT_AMBULATORY_CARE_PROVIDER_SITE_OTHER): Payer: 59 | Admitting: Nurse Practitioner

## 2014-12-09 VITALS — BP 118/72 | HR 82 | Resp 14 | Wt 178.0 lb

## 2014-12-09 DIAGNOSIS — Z3009 Encounter for other general counseling and advice on contraception: Secondary | ICD-10-CM

## 2014-12-09 DIAGNOSIS — Z309 Encounter for contraceptive management, unspecified: Secondary | ICD-10-CM

## 2014-12-09 MED ORDER — DROSPIRENONE-ETHINYL ESTRADIOL 3-0.02 MG PO TABS: 1.0000 | ORAL_TABLET | Freq: Every day | ORAL | Status: DC

## 2014-12-09 NOTE — Progress Notes (Signed)
Patient ID: Jean Burke, female   DOB: [birth year], 24 y.o.   MRN: [id number] S:   This 24 yo WS Female G0 presents to discuss birth control options.  She has had problems with acne since teens.  She has seen multiple dermatologist in the past and has been on topical and oral antibiotics.  She is currently on Loestrin Fe 1.5/30 mcg and does not feel the acne is any better.  She is interested in other options.  Her LMP 11/24/14 and she is SA with same partner.  She has no history of DVT, non smoker, and remains very active.  A: History of acne  On OCP without help for acne  Plan:   Discussed all birth control options including Depo Provera, Nexplanon, IUD, Nuva Ring and OCP.  She is given information as to some of these can actually increase acne problems.  She prefers a change to [place] or Yasmin as was suggested by her MD.  She is given information about studies and the need to remain active and if any sign of DVT to call.  She would prefer Yaz to have a lower dose and may give her more support for acne protection.  She is given new RX for Yaz until AEX in March 2017.  She will  Complete current pack of pills and start new pack after this one.  She will still have birth control protection and will be aware of possible cycle changes that should clear with subsequent pack of pills.  If any problems or concerns to call back.  Consult time:  15 minutes.

## 2014-12-09 NOTE — Patient Instructions (Signed)

## 2014-12-10 NOTE — Progress Notes (Signed)
Encounter reviewed by Dr. Ivor Kishi Amundson C. Silva.  

## 2015-03-17 ENCOUNTER — Other Ambulatory Visit: Payer: Self-pay | Admitting: Nurse Practitioner

## 2015-03-17 MED ORDER — DROSPIRENONE-ETHINYL ESTRADIOL 3-0.02 MG PO TABS: 1.0000 | ORAL_TABLET | Freq: Every day | ORAL | Status: DC

## 2015-03-17 NOTE — Telephone Encounter (Signed)
Medication refill request: Yaz Last AEX:  06-27-14 Next AEX: not yet scheduled  Last MMG (if hormonal medication request): N/A Refill authorized: please advise

## 2015-03-17 NOTE — Telephone Encounter (Signed)
Patient is calling to discuss her birth control prescription. She states she does not have enough to get her to her March annual appointment. She thinks the original prescription may be written wrong.

## 2015-04-07 ENCOUNTER — Other Ambulatory Visit: Payer: Self-pay | Admitting: Nurse Practitioner

## 2015-04-07 NOTE — Telephone Encounter (Signed)
Patient calling requesting a refill on her birth control. Pharmacy on file is correct. See last phone note.

## 2015-04-07 NOTE — Telephone Encounter (Signed)
OCP sent 03/17/15 #3packs/1R. To [facility] [address].  Called pharmacy. Rx was on hold. Raynelle FanningJulie will fill OCP.  Called patient. Notified.

## 2015-07-15 ENCOUNTER — Ambulatory Visit: Payer: 59 | Admitting: Nurse Practitioner

## 2015-07-28 ENCOUNTER — Ambulatory Visit (INDEPENDENT_AMBULATORY_CARE_PROVIDER_SITE_OTHER): Payer: 59 | Admitting: Nurse Practitioner

## 2015-07-28 ENCOUNTER — Encounter: Payer: Self-pay | Admitting: Nurse Practitioner

## 2015-07-28 VITALS — BP 122/76 | HR 76 | Ht 69.75 in | Wt 176.0 lb

## 2015-07-28 DIAGNOSIS — Z0000 Encounter for general adult medical examination without abnormal findings: Secondary | ICD-10-CM

## 2015-07-28 DIAGNOSIS — Z01419 Encounter for gynecological examination (general) (routine) without abnormal findings: Secondary | ICD-10-CM | POA: Diagnosis not present

## 2015-07-28 LAB — POCT URINALYSIS DIPSTICK
BILIRUBIN UA: NEGATIVE
Blood, UA: NEGATIVE
GLUCOSE UA: NEGATIVE
Ketones, UA: NEGATIVE
Leukocytes, UA: NEGATIVE
NITRITE UA: NEGATIVE
Protein, UA: NEGATIVE
UROBILINOGEN UA: NEGATIVE
pH, UA: 7

## 2015-07-28 MED ORDER — DROSPIRENONE-ETHINYL ESTRADIOL 3-0.02 MG PO TABS: 1.0000 | ORAL_TABLET | Freq: Every day | ORAL | Status: DC

## 2015-07-28 NOTE — Progress Notes (Signed)
Reviewed personally.  M. Suzanne Yuliet Needs, MD.  

## 2015-07-28 NOTE — Patient Instructions (Signed)

## 2015-07-28 NOTE — Progress Notes (Signed)
Patient ID: Jean Burke, female   DOB: 04/10/1991, 25 y.o.   MRN: [id number]  25 y.o. G0P0 Single  Caucasian Fe here for annual exam.  [place].  MGM is going at age 25.  Menses lasting 4 days.  Flow is light and no cramps.  Same partner about 1.5 yrs.    Patient's last menstrual period was 07/08/2015 (exact date).          Sexually active: Yes.    The current method of family planning is OCP (estrogen/progesterone) and condoms all of the time.    Exercising: Yes.    Gym/ health club routine includes weights and cardio.  Exercising at least 2-3 times per week. Smoker:  no  Health Maintenance: Pap: 06/27/14, Negative Gardasil completed 2007 TDaP: <10 years  HIV: declines  Labs: HB: declined  Urine: Negative    reports that she has never smoked. She has never used smokeless tobacco. She reports that she drinks alcohol. She reports that she does not use illicit drugs.  Past Medical History  Diagnosis Date  . H/O renal calculi 2011    right side and passed on her own    History reviewed. No pertinent past surgical history.  Current Outpatient Prescriptions  Medication Sig Dispense Refill  . cholecalciferol (VITAMIN D) 1000 units tablet Take 1,000 Units by mouth daily.    . drospirenone-ethinyl estradiol (YAZ,GIANVI,LORYNA) 3-0.02 MG tablet Take 1 tablet by mouth daily. 3 Package 1  . Multiple Vitamin (MULTIVITAMIN) tablet Take 1 tablet by mouth daily.    . Omega-3 Fatty Acids (FISH OIL) 1000 MG CAPS Take 2 capsules by mouth daily.    . Probiotic Product (PROBIOTIC PO) Take by mouth.     No current facility-administered medications for this visit.    Family History  Problem Relation Age of Onset  . Cancer Paternal Grandmother     thyroid    ROS:  Pertinent items are noted in HPI.  Otherwise, a comprehensive ROS was negative.  Exam:   BP 122/76 mmHg  Pulse 76  Ht 5' 9.75" (1.772 m)  Wt 176 lb (79.833 kg)  BMI 25.42 kg/m2  LMP 07/08/2015  (Exact Date) Height: 5' 9.75" (177.2 cm) Ht Readings from Last 3 Encounters:  07/28/15 5' 9.75" (1.772 m)  06/27/14 5' 9.25" (1.759 m)  05/30/13 5\' 9"  (1.753 m)    General appearance: alert, cooperative and appears stated age Head: Normocephalic, without obvious abnormality, atraumatic Neck: no adenopathy, supple, symmetrical, trachea midline and thyroid normal to inspection and palpation Lungs: clear to auscultation bilaterally Breasts: normal appearance, no masses or tenderness Heart: regular rate and rhythm Abdomen: soft, non-tender; no masses,  no organomegaly Extremities: extremities normal, atraumatic, no cyanosis or edema Skin: Skin color, texture, turgor normal. No rashes or lesions Lymph nodes: Cervical, supraclavicular, and axillary nodes normal. No abnormal inguinal nodes palpated Neurologic: Grossly normal   Pelvic: External genitalia:  no lesions              Urethra:  normal appearing urethra with no masses, tenderness or lesions              Bartholin's and Skene's: normal                 Vagina: normal appearing vagina with normal color and discharge, no lesions              Cervix: anteverted  Pap taken: No. Bimanual Exam:  Uterus:  normal size, contour, position, consistency, mobility, non-tender              Adnexa: no mass, fullness, tenderness               Rectovaginal: Confirms               Anus:  normal sphincter tone, no lesions  Chaperone present: no  A:  Well Woman with normal exam  Contraception needs  Needle phobic   P:   Reviewed health and wellness pertinent to exam  Pap smear as above  Refill on OCP - Yaz for a year  Counseled on breast self exam, adequate intake of calcium and vitamin D, diet and exercise return annually or prn  An After Visit Summary was printed and given to the patient.

## 2016-04-20 ENCOUNTER — Encounter (INDEPENDENT_AMBULATORY_CARE_PROVIDER_SITE_OTHER): Payer: Self-pay | Admitting: Orthopaedic Surgery

## 2016-04-20 ENCOUNTER — Ambulatory Visit (INDEPENDENT_AMBULATORY_CARE_PROVIDER_SITE_OTHER): Payer: 59 | Admitting: Orthopaedic Surgery

## 2016-04-20 ENCOUNTER — Ambulatory Visit (INDEPENDENT_AMBULATORY_CARE_PROVIDER_SITE_OTHER): Payer: 59

## 2016-04-20 VITALS — BP 123/82 | HR 96 | Ht 69.0 in | Wt 178.0 lb

## 2016-04-20 DIAGNOSIS — M545 Low back pain, unspecified: Secondary | ICD-10-CM | POA: Insufficient documentation

## 2016-04-20 DIAGNOSIS — G8929 Other chronic pain: Secondary | ICD-10-CM

## 2016-04-20 NOTE — Progress Notes (Signed)
Office Visit Note   Patient: Jean MerlKatelyn Crossley           Date of Birth: 08/11/1990           MRN: [id number] Visit Date: 04/20/2016              Requested by: No referring provider defined for this encounter. PCP: No PCP Per Patient   Assessment & Plan: Visit Diagnoses:  1. Chronic right-sided low back pain without sciatica     Plan: We'll set patient up for some formal physical therapy. I'll recheck her in one month. She has no evidence of radiculopathy on exam.  Follow-Up Instructions: No Follow-up on file.   Orders:  Orders Placed This Encounter  Procedures  . XR Lumbar Spine 2-3 Views   No orders of the defined types were placed in this encounter.     Procedures: No procedures performed   Clinical Data: No additional findings.   Subjective: Chief Complaint  Patient presents with  . Lower Back - Pain    Patient presents with chronic right sided low back pain. She has had this pain for years with no known injury. She recently got a dog and notices the pain is much worse when walking the dog. She uses the heating pad, rest, and aleve prn as needed. She denies radiculopathy.   Patient denies any bowel or bladder symptoms. She states she walks her 6143-month-old puppy up to almost 10 miles a day. Her pain tends to wax and wane as always over the right posterior iliac crest none on the left no numbness or tingling in her feet no fever or chills. 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 Review of Systems  Constitutional: Negative for chills and diaphoresis.  HENT: Negative for ear discharge, ear pain and nosebleeds.   Eyes: Negative for discharge and visual disturbance.  Respiratory: Negative for cough, choking and shortness of breath.   Cardiovascular: Negative for chest pain and palpitations.  Gastrointestinal: Negative for abdominal distention and abdominal pain.  Endocrine: Negative for cold intolerance and heat intolerance.    Genitourinary: Negative for flank pain and hematuria.  Musculoskeletal:       Patient past years is been through chiropractic treatment she's done yoga, electric stimulation is been performed, core strengthening exercises.  Problems with stairs.  Skin: Negative for rash and wound.  Neurological: Negative for seizures and speech difficulty.  Hematological: Negative for adenopathy. Does not bruise/bleed easily.  Psychiatric/Behavioral: Negative for agitation and suicidal ideas.     Objective: Vital Signs: BP 123/82   Pulse 96   Ht 5\' 9"  (1.753 m)   Wt 178 lb (80.7 kg)   BMI 26.29 kg/m   Physical Exam  Constitutional: She is oriented to person, place, and time. She appears well-developed.  HENT:  Head: Normocephalic.  Right Ear: External ear normal.  Left Ear: External ear normal.  Eyes: Pupils are equal, round, and reactive to light.  Neck: No tracheal deviation present. No thyromegaly present.  Cardiovascular: Normal rate.   Pulmonary/Chest: Effort normal.  Abdominal: Soft.  Musculoskeletal:  No weakness of quads hamstrings ankle dorsiflexion plantar flexion. Normal sensation. Pulses are normal no swelling of the lower extremities. She can heel and toe walk normally. Negative straight leg raising 90. Negative Faber normal hip range of motion these reach full extension good quad strength. Tenderness over the right posterior iliac spine. No sciatic notch tenderness. Mild hematoma gluteal region from the fall after Thanksgiving. Area is not  fluctuant this suggest residual fluid collection. Normal gluteus maximus strength. Trochanters are nontender. Knee and ankle jerk are 2+ and symmetrical.  Neurological: She is alert and oriented to person, place, and time.  Skin: Skin is warm and dry.  Psychiatric: She has a normal mood and affect. Her behavior is normal.    Ortho Exam normal strength range of motion hips knees and ankles. Normal sensation L2-S1 dermatome.  Specialty  Comments:  No specialty comments available.  Imaging: Xr Lumbar Spine 2-3 Views  Result Date: 04/20/2016 AP and lateral lumbar spine x-rays reviewed. She has trace lumbar curvature less than 10. Narrowing of the L5-S1 disc space on lateral x-ray with minimal facet degenerative changes. Impression: Mild narrowing L5-S1, minimal lumbar curve.    PMFS History: There are no active problems to display for this patient.  Past Medical History:  Diagnosis Date  . H/O renal calculi 09/09/09   right side and passed on her own    Family History  Problem Relation Age of Onset  . Cancer Paternal Grandmother     thyroid  . Hyperlipidemia Father     No past surgical history on file. Social History   Occupational History  . Not on file.   Social History Main Topics  . Smoking status: Never Smoker  . Smokeless tobacco: Never Used  . Alcohol use 0.0 - 1.0 oz/week  . Drug use: No  . Sexual activity: Yes    Partners: Male    Birth control/ protection: Pill, Condom

## 2016-04-26 ENCOUNTER — Ambulatory Visit: Payer: 59 | Attending: Orthopaedic Surgery | Admitting: Physical Therapy

## 2016-04-26 ENCOUNTER — Encounter: Payer: Self-pay | Admitting: Physical Therapy

## 2016-04-26 DIAGNOSIS — M545 Low back pain: Secondary | ICD-10-CM | POA: Diagnosis not present

## 2016-04-26 DIAGNOSIS — G8929 Other chronic pain: Secondary | ICD-10-CM

## 2016-04-26 NOTE — Therapy (Signed)
[facility] [address] [place], [place], [address] Phone: [phone]   Fax:  [phone]  Physical Therapy Evaluation  Patient Details  Name: Jean Burke MRN: [id number] Date of Birth: [birth year] Referring Provider: Annell Greening, MD  Encounter Date: 04/26/2016      PT End of Session - 04/26/16 0843    Visit Number 1   Number of Visits 5   Date for PT Re-Evaluation 05/27/16   Authorization Type UHC 20 visit limit   PT Start Time 0801   PT Stop Time 0843   PT Time Calculation (min) 42 min   Activity Tolerance Patient tolerated treatment well   Behavior During Therapy [facility] for tasks assessed/performed      Past Medical History:  Diagnosis Date  . H/O renal calculi Sep 07, 2009   right side and passed on her own    History reviewed. No pertinent surgical history.  There were no vitals filed for this visit.       Subjective Assessment - 04/26/16 0804    Subjective pt reports back pain 10 years in the same place. did PT when she was younger, pain comes and goes. Adopted a dog and walks about 10 miles per day which has made pain consistent. Throbbing when pain comes on. Walks with leash in L hand   Patient Stated Goals decrease pain   Currently in Pain? Yes   Pain Score 2    Pain Location Back   Pain Orientation Right   Pain Descriptors / Indicators Throbbing   Pain Type Chronic pain   Pain Onset More than a month ago   Pain Frequency Intermittent   Aggravating Factors  walking   Pain Relieving Factors yoga, heat            [facility] PT Assessment - 04/26/16 0001      Assessment   Medical Diagnosis chronic R LBP with radicular symptoms   Referring Provider Annell Greening, MD   Hand Dominance Right   Next MD Visit 05/31/16   Prior Therapy when she was younger     Precautions   Precautions None     Restrictions   Weight Bearing Restrictions No     Balance Screen   Has the patient fallen in the past 6 months Yes   How many times? 1  fell down stairs     Cabin crew Private residence   Living Arrangements Alone;Other (Comment)  animals   Additional Comments 3 steps to enter     Prior Function   Level of Independence Independent     Cognition   Overall Cognitive Status Within Functional Limits for tasks assessed     Observation/Other Assessments   Focus on Therapeutic Outcomes (FOTO)  65% ability     Sensation   Additional Comments WFL     ROM / Strength   AROM / PROM / Strength Strength     Strength   Strength Assessment Site Hip   Right/Left Hip Right;Left   Left Hip ABduction 4+/5     Palpation   Palpation comment denies TTP                   [facility] Adult PT Treatment/Exercise - 04/26/16 0001      Exercises   Exercises Knee/Hip     Knee/Hip Exercises: Stretches   Passive Hamstring Stretch Limitations standing 2x30s   Quad Stretch Limitations standing heel to seat   ITB Stretch Limitations standing figure 4  Knee/Hip Exercises: Standing   Gait Training gait with lift and assessment                PT Education - 04/26/16 1335    Education provided Yes   Education Details anatomy of condition, POC, HEP, exercise form/rationale   Person(s) Educated Patient   Methods Explanation;Demonstration;Tactile cues;Verbal cues;Handout   Comprehension Verbalized understanding;Returned demonstration;Verbal cues required;Tactile cues required;Need further instruction          PT Short Term Goals - 04/26/16 1346      PT SHORT TERM GOAL #1   Title Pt will be able to walk dog with minimal to no back pain by 2/16   Baseline can be very severe   Time 4   Period Weeks   Status New     PT SHORT TERM GOAL #2   Title Pt will verbalize complaince and understanding of stretches before and after long walks to decrease spasm and improve flexibility   Baseline began educating at eval   Time 4   Period Weeks   Status New     PT SHORT  TERM GOAL #3   Title L hip abduction strength to 5/5 for equalized R/L muscular support to lumbo pelvic region    Baseline see flowsheet   Time 4   Period Weeks   Status New     PT SHORT TERM GOAL #4   Title FOTO to 76% ability to indicate significant functional improvement   Baseline 65% ability at eval   Time 4   Period Weeks   Status New                  Plan - 04/26/16 1339    Clinical Impression Statement Pt presents to PT with complaints of R lower back pain that occurs when walking, can become severe and can think of 3-4 times when the pain "took her breath away"  the pain is not palpable. Pt reports yoga is helpful to decrease pain indicating musculoskeletal involvement. Pt noted to have LLD- R shorter than L by 1/2 inch. Pt was provided with shoe lift for R shoe, removed one layer. Pt did not feel much of a difference but only walked for a few minutes on treadmil. Lift noted to decrease vaulting over L and R hip drop that was caused by R heel reaching for strike. Pt was asked to wear the lift when she is walking for longer periods since that is when she gets pain. Pt was worried that going on/off of lift would be irritating to her back because she would not wear the lift in many of her shoes. I asked that she wear the lift for longer walks since that is when she gets pain but not to worry about it otherwise since she does not get pain. pt will benefit from continued PT to stabilize lumbo pelvic region in lifted position.    Rehab Potential Good   PT Frequency 1x / week   PT Duration 4 weeks   PT Treatment/Interventions ADLs/Self Care Home Management;Cryotherapy;Electrical Stimulation;Iontophoresis 4mg /ml Dexamethasone;Functional mobility training;Stair training;Gait training;Ultrasound;Traction;Moist Heat;Therapeutic activities;Therapeutic exercise;Neuromuscular re-education;Patient/family education;Orthotic Fit/Training;Passive range of motion;Manual techniques;Dry  needling;Taping   PT Next Visit Plan re-evaluate addition of lift, abdominal engagement, LE stretching   PT Home Exercise Plan wear lift when walking dog, reverse clam, standing hamstring, quad and hip stretches.    Consulted and Agree with Plan of Care Patient      Patient will benefit from skilled therapeutic intervention  in order to improve the following deficits and impairments:  Abnormal gait, Difficulty walking, Increased muscle spasms, Decreased activity tolerance, Pain  Visit Diagnosis: Chronic right-sided low back pain, with sciatica presence unspecified - Plan: PT plan of care cert/re-cert     Problem List Patient Active Problem List   Diagnosis Date Noted  . Chronic right-sided low back pain without sciatica 04/20/2016    Nasia Cannan C. Chaia Ikard PT, DPT 04/26/16 1:53 PM   [facility] [address] [place], [place], [address] Phone: [phone]   Fax:  [phone]  Name: Jean Burke MRN: [id number] Date of Birth: 1991/02/17

## 2016-05-04 ENCOUNTER — Ambulatory Visit: Payer: 59 | Admitting: Physical Therapy

## 2016-05-04 DIAGNOSIS — G8929 Other chronic pain: Secondary | ICD-10-CM

## 2016-05-04 DIAGNOSIS — M545 Low back pain: Principal | ICD-10-CM

## 2016-05-04 NOTE — Therapy (Addendum)
[place], [place], [address] Phone: [phone]   Fax:  [phone]  Physical Therapy Treatment/Discharge Summary  Patient Details  Name: Jean Burke MRN: [id number] Date of Birth: [birth year] Referring Provider: Rodell Perna, MD  Encounter Date: 05/04/2016      PT End of Session - 05/04/16 0718    Visit Number 2   Number of Visits 5   Date for PT Re-Evaluation 05/27/16   Authorization Type UHC 20 visit limit   PT Start Time 0715   PT Stop Time 0755   PT Time Calculation (min) 40 min      Past Medical History:  Diagnosis Date  . H/O renal calculi 2009-07-08   right side and passed on her own    No past surgical history on file.  There were no vitals filed for this visit.      Subjective Assessment - 05/04/16 0716    Subjective Sometimes seems helpful. Heel lift trial like 10 times.    Currently in Pain? Yes   Pain Score 3    Pain Location Back   Pain Orientation Right   Pain Descriptors / Indicators Pressure   Pain Frequency Intermittent   Aggravating Factors  walking   Pain Relieving Factors yoga, heat                          [facility] Adult PT Treatment/Exercise - 05/04/16 0001      Lumbar Exercises: Stretches   Passive Hamstring Stretch Limitations standing 2x30s  cues to prevent lumbar flexion    Quad Stretch Limitations standing heel to seat 30 sec x 2    ITB Stretch Limitations standing figure 4     Lumbar Exercises: Aerobic   Stationary Bike Recumbent Bike L2 x 5 minutes      Lumbar Exercises: Supine   Ab Set 10 reps   Heel Slides 10 reps   Bent Knee Raise Limitations alternating x 45 seconds    Other Supine Lumbar Exercises knee to opposite elbow abdominal obliques x 20, also with heel slides x 20      Lumbar Exercises: Quadruped   Madcat/Old Horse 5 reps   Opposite Arm/Leg Raise 10 reps  10 seconds   Opposite Arm/Leg Raise Limitations cues for core and  neutral spine      Knee/Hip Exercises: Stretches   Gastroc Stretch Limitations runners stretch 3 x 30 sec each      Knee/Hip Exercises: Sidelying   Hip ABduction Limitations Hip abduction with flexion and IR (commerford clam) x 20 bilateral                 PT Education - 05/04/16 0804    Education provided Yes   Education Details HEP   Person(s) Educated Patient   Methods Explanation;Handout   Comprehension Verbalized understanding          PT Short Term Goals - 04/26/16 1346      PT SHORT TERM GOAL #1   Title Pt will be able to walk dog with minimal to no back pain by 2/16   Baseline can be very severe   Time 4   Period Weeks   Status New     PT SHORT TERM GOAL #2   Title Pt will verbalize complaince and understanding of stretches before and after long walks to decrease spasm and improve flexibility   Baseline began educating at eval   Time 4  Period Weeks   Status New     PT SHORT TERM GOAL #3   Title L hip abduction strength to 5/5 for equalized R/L muscular support to lumbo pelvic region    Baseline see flowsheet   Time 4   Period Weeks   Status New     PT SHORT TERM GOAL #4   Title FOTO to 76% ability to indicate significant functional improvement   Baseline 65% ability at eval   Time 4   Period Weeks   Status New                  Plan - 05/04/16 9747    Clinical Impression Statement Pt reports heel lift seemed helpful sometimes. We targeted core today with pt feeling unchallenged with basic core so tried moderate core strengthening and added to HEP. Pt returns in 1 week. Asked her to be consistent with HEP 2 x per day and we can assess progress next visit.    PT Next Visit Plan re-evaluate addition of lift, assess abdominal strength HEP , LE stretching   PT Home Exercise Plan wear lift when walking dog, reverse clam, standing hamstring, quad and hip stretches.    Consulted and Agree with Plan of Care Patient      Patient will  benefit from skilled therapeutic intervention in order to improve the following deficits and impairments:  Abnormal gait, Difficulty walking, Increased muscle spasms, Decreased activity tolerance, Pain  Visit Diagnosis: Chronic right-sided low back pain, with sciatica presence unspecified     Problem List Patient Active Problem List   Diagnosis Date Noted  . Chronic right-sided low back pain without sciatica 04/20/2016    Dorene Ar, [place] 05/04/2016, 8:06 AM  [place], [place], [address] Phone: [phone]   Fax:  [phone]  Name: Jean Burke MRN: [id number] Date of Birth: [birth year]  PHYSICAL THERAPY DISCHARGE SUMMARY  Visits from Start of Care: 2  Current functional level related to goals / functional outcomes: See above   Remaining deficits: See above   Education / Equipment: Anatomy of condition, POC, HEP, exercise form/rationale  Plan: Patient agrees to discharge.  Patient goals were not met. Patient is being discharged due to being pleased with the current functional level.  ?????    Pt called to cancel appointments, is doing better.  Jean Burke C. Hightower PT, DPT 06/14/16 1:22 PM

## 2016-05-11 ENCOUNTER — Ambulatory Visit: Payer: 59 | Admitting: Physical Therapy

## 2016-05-19 ENCOUNTER — Ambulatory Visit: Payer: 59 | Admitting: Physical Therapy

## 2016-05-25 ENCOUNTER — Ambulatory Visit: Payer: 59 | Admitting: Physical Therapy

## 2016-05-31 ENCOUNTER — Ambulatory Visit (INDEPENDENT_AMBULATORY_CARE_PROVIDER_SITE_OTHER): Payer: 59 | Admitting: Orthopaedic Surgery

## 2016-07-29 ENCOUNTER — Ambulatory Visit: Payer: 59 | Admitting: Nurse Practitioner

## 2016-08-01 ENCOUNTER — Ambulatory Visit: Payer: 59 | Admitting: Nurse Practitioner

## 2016-08-15 ENCOUNTER — Ambulatory Visit (INDEPENDENT_AMBULATORY_CARE_PROVIDER_SITE_OTHER): Payer: 59 | Admitting: Nurse Practitioner

## 2016-08-15 ENCOUNTER — Encounter: Payer: Self-pay | Admitting: Nurse Practitioner

## 2016-08-15 VITALS — BP 120/72 | HR 88 | Resp 16 | Ht 68.5 in | Wt 165.6 lb

## 2016-08-15 DIAGNOSIS — Z0000 Encounter for general adult medical examination without abnormal findings: Secondary | ICD-10-CM | POA: Diagnosis not present

## 2016-08-15 DIAGNOSIS — Z01419 Encounter for gynecological examination (general) (routine) without abnormal findings: Secondary | ICD-10-CM

## 2016-08-15 DIAGNOSIS — Z124 Encounter for screening for malignant neoplasm of cervix: Secondary | ICD-10-CM | POA: Diagnosis not present

## 2016-08-15 MED ORDER — DROSPIRENONE-ETHINYL ESTRADIOL 3-0.02 MG PO TABS: 1.0000 | ORAL_TABLET | Freq: Every day | ORAL | 4 refills | Status: DC

## 2016-08-15 NOTE — Progress Notes (Signed)
26 y.o. G0P0 Single  Caucasian Fe here for annual exam.  Menses last 4 days, no cramps. Mild PMS.  Same partner X 3 yr.  Spotting X 2 days from missed pills since she was in [place].  Boyfriend is in ICU with multiple injuries and on intubation.  Patient's last menstrual period was 08/02/2016.          Sexually active: Yes.    The current method of family planning is OCP (estrogen/progesterone).    Exercising: Yes.    Yoga, Pure Barre Smoker:  no  Health Maintenance: Pap: 06/27/14, Negative; 05/30/13 Neg Gardasil completed 2005-08-20 TDaP: <10 years  declines update today. HIV: declines  Labs: none Self Breast exams: no    reports that she has never smoked. She has never used smokeless tobacco. She reports that she drinks alcohol. She reports that she does not use drugs.  Past Medical History:  Diagnosis Date  . H/O renal calculi 20-Aug-2009   right side and passed on her own    History reviewed. No pertinent surgical history.  Current Outpatient Prescriptions  Medication Sig Dispense Refill  . drospirenone-ethinyl estradiol (YAZ,GIANVI,LORYNA) 3-0.02 MG tablet Take 1 tablet by mouth daily. 3 Package 4  . Multiple Vitamin (MULTIVITAMIN) tablet Take 1 tablet by mouth daily.    . Probiotic Product (PROBIOTIC PO) Take by mouth.     No current facility-administered medications for this visit.     Family History  Problem Relation Age of Onset  . Cancer Paternal Grandmother     thyroid  . Hyperlipidemia Father     ROS:  Pertinent items are noted in HPI.  Otherwise, a comprehensive ROS was negative.  Exam:   BP 120/72 (BP Location: Right Arm, Patient Position: Sitting, Cuff Size: Normal)   Pulse 88   Resp 16   Ht 5' 8.5" (1.74 m)   Wt 165 lb 9.6 oz (75.1 kg)   LMP 08/02/2016   BMI 24.81 kg/m  Height: 5' 8.5" (174 cm) Ht Readings from Last 3 Encounters:  08/15/16 5' 8.5" (1.74 m)  04/20/16 5\' 9"  (1.753 m)  07/28/15 5' 9.75" (1.772 m)    General appearance: alert, cooperative  and appears stated age Head: Normocephalic, without obvious abnormality, atraumatic Neck: no adenopathy, supple, symmetrical, trachea midline and thyroid normal to inspection and palpation Lungs: clear to auscultation bilaterally Breasts: normal appearance, no masses or tenderness Heart: regular rate and rhythm Abdomen: soft, non-tender; no masses,  no organomegaly Extremities: extremities normal, atraumatic, no cyanosis or edema Skin: Skin color, texture, turgor normal. No rashes or lesions Lymph nodes: Cervical, supraclavicular, and axillary nodes normal. No abnormal inguinal nodes palpated Neurologic: Grossly normal   Pelvic: External genitalia:  no lesions              Urethra:  normal appearing urethra with no masses, tenderness or lesions              Bartholin's and Skene's: normal                 Vagina: normal appearing vagina with normal color and discharge, no lesions              Cervix: anteverted              Pap taken: Yes.   Bimanual Exam:  Uterus:  normal size, contour, position, consistency, mobility, non-tender              Adnexa: no mass, fullness, tenderness  Rectovaginal: Confirms               Anus:  normal sphincter tone, no lesions  Chaperone present: yes  A:  Well Woman with normal exam  Contraception needs    Needle phobic  Situational stressors   P:   Reviewed health and wellness pertinent to exam  Pap smear: yes  Refill on OCP for a year  Counseled on breast self exam, STD prevention, HIV risk factors and prevention, use and side effects of OCP's, adequate intake of calcium and vitamin D, diet and exercise return annually or prn  An After Visit Summary was printed and given to the patient.

## 2016-08-15 NOTE — Patient Instructions (Signed)

## 2016-08-16 LAB — IPS PAP TEST WITH REFLEX TO HPV

## 2016-08-16 NOTE — Progress Notes (Signed)
Encounter reviewed by Dr. Brook Amundson C. Silva.  

## 2016-08-26 ENCOUNTER — Other Ambulatory Visit: Payer: Self-pay | Admitting: Nurse Practitioner

## 2016-11-09 ENCOUNTER — Telehealth: Payer: Self-pay | Admitting: Obstetrics and Gynecology

## 2016-11-09 NOTE — Telephone Encounter (Signed)
Left message on voicemail to call and reschedule cancelled appointment. Mail letter °

## 2016-12-15 DIAGNOSIS — Z23 Encounter for immunization: Secondary | ICD-10-CM | POA: Diagnosis not present

## 2017-05-04 DIAGNOSIS — Z23 Encounter for immunization: Secondary | ICD-10-CM | POA: Diagnosis not present

## 2017-06-22 ENCOUNTER — Telehealth (INDEPENDENT_AMBULATORY_CARE_PROVIDER_SITE_OTHER): Payer: Self-pay

## 2017-06-22 NOTE — Telephone Encounter (Signed)
Patient would like to know what other options can be done for her back pain?  Stated that she has been going to PT, but it's not helping and would like something more than PT.  Cb# is [phone].  Please advise.  Thank You.

## 2017-06-22 NOTE — Telephone Encounter (Signed)
Please advise. Would you like for me to schedule follow up appointment for patient?

## 2017-06-22 NOTE — Telephone Encounter (Signed)
Needs ROV thanks 

## 2017-06-26 NOTE — Telephone Encounter (Signed)
I called patient.  Appt made

## 2017-06-28 ENCOUNTER — Ambulatory Visit (INDEPENDENT_AMBULATORY_CARE_PROVIDER_SITE_OTHER): Payer: 59 | Admitting: Orthopaedic Surgery

## 2017-06-28 ENCOUNTER — Ambulatory Visit (INDEPENDENT_AMBULATORY_CARE_PROVIDER_SITE_OTHER): Payer: 59 | Admitting: Surgery

## 2017-06-28 ENCOUNTER — Encounter (INDEPENDENT_AMBULATORY_CARE_PROVIDER_SITE_OTHER): Payer: Self-pay | Admitting: Orthopaedic Surgery

## 2017-06-28 VITALS — BP 134/87 | HR 82 | Ht 69.0 in | Wt 175.0 lb

## 2017-06-28 DIAGNOSIS — M545 Low back pain, unspecified: Secondary | ICD-10-CM

## 2017-06-28 DIAGNOSIS — G8929 Other chronic pain: Secondary | ICD-10-CM

## 2017-06-28 NOTE — Progress Notes (Signed)
Office Visit Note   Patient: Jean Burke           Date of Birth: 05/05/1990           MRN: [id number] Visit Date: 06/28/2017              Requested by: No referring provider defined for this encounter. PCP: Patient, No Pcp Per   Assessment & Plan: Visit Diagnoses:  1. Chronic right-sided low back pain without sciatica     Plan: Patient is having progressive symptoms now with night pain.  She is requests proceeding with an MRI scan.  She has been through therapy anti-inflammatories activity modification and bothers her on a daily basis and now is waking up at night when she sleeps.  Office follow-up after MRI scan.  Follow-Up Instructions: No Follow-up on file.   Orders:  No orders of the defined types were placed in this encounter.  No orders of the defined types were placed in this encounter.     Procedures: No procedures performed   Clinical Data: No additional findings.   Subjective: Chief Complaint  Patient presents with  . Lower Back - Pain, Follow-up    HPI 27 year old female returns with ongoing problems with her back.  She states the current flare is been present for 7-day weeks it bothers her she is not able to walk her dog like she normally does.  She has pain with turning twisting the pains been waking her up at night she has pain in the back that radiates into the right buttocks.  She is been through physical therapy they have a heel lift inside her right shoe.  1/4 inch lift.  Has been taking ibuprofen 800 mg 2-3 times per day and states less does not help with her pain symptoms.  She is using ice repetitively.  There is times when she is not participating in certain activities due to her back pain.  Her symptoms been going on and off since college.  Her flares have been more frequent and this ones now been present for 8 weeks without relief.  Patient states "I need to do something"  Review of Systems review of systems updated unchanged from 04/20/2016  office visit other than as mentioned in HPI.   Objective: Vital Signs: BP 134/87   Pulse 82   Ht 5\' 9"  (1.753 m)   Wt 175 lb (79.4 kg)   BMI 25.84 kg/m   Physical Exam  Constitutional: She is oriented to person, place, and time. She appears well-developed.  HENT:  Head: Normocephalic.  Right Ear: External ear normal.  Left Ear: External ear normal.  Eyes: Pupils are equal, round, and reactive to light.  glasses  Neck: No tracheal deviation present. No thyromegaly present.  Cardiovascular: Normal rate.  Pulmonary/Chest: Effort normal.  Abdominal: Soft.  Neurological: She is alert and oriented to person, place, and time.  Skin: Skin is warm and dry.  Psychiatric: She has a normal mood and affect. Her behavior is normal.    Ortho Exam patient has some palpation lumbosacral junction.  Mild sciatic notch tenderness on the right negative straight leg raising 90 degrees.  Knee and ankle jerk are intact.  Anterior tib gastrocsoleus are strong there is no atrophy.  Normal hip range of motion.  SI joint tests are negative.  Specialty Comments:  No specialty comments available.  Imaging: Previous x-rays lumbar demonstrated slight lumbar curvature.  Narrowing with some facet changes at L5-S1.  L5-S1 disc space narrowing.  PMFS History: Patient Active Problem List   Diagnosis Date Noted  . Chronic right-sided low back pain without sciatica 04/20/2016   Past Medical History:  Diagnosis Date  . H/O renal calculi 08/27/2009   right side and passed on her own    Family History  Problem Relation Age of Onset  . Cancer Paternal Grandmother        thyroid  . Hyperlipidemia Father     No past surgical history on file. Social History   Occupational History  . Not on file  Tobacco Use  . Smoking status: Never Smoker  . Smokeless tobacco: Never Used  Substance and Sexual Activity  . Alcohol use: Yes    Alcohol/week: 0.0 - 1.0 oz  . Drug use: No  . Sexual activity: Yes     Partners: Male    Birth control/protection: Pill, Condom

## 2017-06-28 NOTE — Addendum Note (Signed)
Addended by: Rogers SeedsYEATTS, [place] Downs M on: 06/28/2017 12:13 PM   Modules accepted: Orders

## 2017-07-13 ENCOUNTER — Other Ambulatory Visit: Payer: 59

## 2017-07-18 ENCOUNTER — Ambulatory Visit
Admission: RE | Admit: 2017-07-18 | Discharge: 2017-07-18 | Disposition: A | Discharge status: Home / Self Care | Payer: 59 | Source: Ambulatory Visit | Attending: Orthopaedic Surgery | Admitting: Orthopaedic Surgery

## 2017-07-18 DIAGNOSIS — G8929 Other chronic pain: Secondary | ICD-10-CM

## 2017-07-18 DIAGNOSIS — M545 Low back pain, unspecified: Secondary | ICD-10-CM

## 2017-07-21 ENCOUNTER — Encounter (INDEPENDENT_AMBULATORY_CARE_PROVIDER_SITE_OTHER): Payer: Self-pay | Admitting: Orthopaedic Surgery

## 2017-07-21 ENCOUNTER — Ambulatory Visit (INDEPENDENT_AMBULATORY_CARE_PROVIDER_SITE_OTHER): Payer: 59 | Admitting: Orthopaedic Surgery

## 2017-07-21 VITALS — BP 126/76 | HR 80 | Ht 69.0 in | Wt 175.0 lb

## 2017-07-21 DIAGNOSIS — M5126 Other intervertebral disc displacement, lumbar region: Secondary | ICD-10-CM | POA: Diagnosis not present

## 2017-07-21 NOTE — Progress Notes (Signed)
Office Visit Note   Patient: Jean Burke           Date of Birth: 06/10/1990           MRN: [id number] Visit Date: 07/21/2017              Requested by: No referring provider defined for this encounter. PCP: Patient, No Pcp Per   Assessment & Plan: Visit Diagnoses:  1. Protrusion of lumbar intervertebral disc     Plan: Patient's pain is eased off after 8 weeks of pain.  MRI scan is reviewed pathophysiology discussed.  She has central disc protrusion right paracentral high intensity zone at L5-S1.  Incidental left L1 to protrusion which is asymptomatic not hitting a nerve root.  Mild facet changes noted L4-5 and slightly at L5-S1.  She gets recurrent symptoms she could try a L5-S1 epidural injection.  She will continue her normal workout activities we discussed workout activities that load the disc more that she should avoid.  He is already made these changes.  She will call if she has recurrent symptoms.  I gave her a copy of the MRI report.  Follow-Up Instructions: Return if symptoms worsen or fail to improve.   Orders:  No orders of the defined types were placed in this encounter.  No orders of the defined types were placed in this encounter.     Procedures: No procedures performed   Clinical Data: No additional findings.   Subjective: Chief Complaint  Patient presents with  . Lower Back - Pain, Follow-up    MRI Lumbar review    HPI 27 year old female returns with ongoing intermittent back pain symptoms episodic intermittent.  She states since her MRI scan pain got better but this time she had her symptoms for a total of 8 weeks.  Her symptoms have been present for 5 years.  She works out regularly has been through physical therapy uses anti-inflammatories intermittent ice core strengthening exercises.  She played multiple sports when she was younger.  No bowel or bladder symptoms no fever or chills.  MRI scan is available for review.  Review of Systems 14 point  review of systems updated unchanged from 06/28/2017 office visit.   Objective: Vital Signs: BP 126/76   Pulse 80   Ht 5\' 9"  (1.753 m)   Wt 175 lb (79.4 kg)   BMI 25.84 kg/m   Physical Exam  Constitutional: She is oriented to person, place, and time. She appears well-developed.  HENT:  Head: Normocephalic.  Right Ear: External ear normal.  Left Ear: External ear normal.  Eyes: Pupils are equal, round, and reactive to light.  Neck: No tracheal deviation present. No thyromegaly present.  Cardiovascular: Normal rate.  Pulmonary/Chest: Effort normal.  Abdominal: Soft.  Neurological: She is alert and oriented to person, place, and time.  Skin: Skin is warm and dry.  Psychiatric: She has a normal mood and affect. Her behavior is normal.    Ortho Exam minimal discomfort lumbosacral palpation.  Negative straight leg raising 90 degrees.  Reflexes lower extremity strength are intact.  SI joint tests are negative normal hip and knee range of motion no calf tenderness no rash over exposed skin.  Specialty Comments:  No specialty comments available.  Imaging: CLINICAL DATA:  Chronic low back pain for 8 years  EXAM: MRI LUMBAR SPINE WITHOUT CONTRAST  TECHNIQUE: Multiplanar, multisequence MR imaging of the lumbar spine was performed. No intravenous contrast was administered.  COMPARISON:  None.  FINDINGS: Segmentation:  Standard.  Alignment:  Physiologic.  Vertebrae: No fracture, evidence of discitis, or bone lesion. Schmorl's node along the inferior endplate of L1 and L2.  Conus medullaris and cauda equina: Conus extends to the T12 level. Conus and cauda equina appear normal.  Paraspinal and other soft tissues: No acute paraspinal abnormality.  Disc levels:  Disc spaces: Degenerative disc disease with disc desiccation and disc height loss at L5-S1.  T12-L1: No significant disc bulge. No evidence of neural foraminal stenosis. No central canal  stenosis.  L1-L2: Small left paracentral disc protrusion. No evidence of neural foraminal stenosis. No central canal stenosis.  L2-L3: No significant disc bulge. No evidence of neural foraminal stenosis. No central canal stenosis.  L3-L4: No significant disc bulge. No evidence of neural foraminal stenosis. No central canal stenosis.  L4-L5: No significant disc bulge. No evidence of neural foraminal stenosis. No central canal stenosis. Mild bilateral facet arthropathy.  L5-S1: Small central disc protrusion. Mild bilateral facet arthropathy. No evidence of neural foraminal stenosis. No central canal stenosis.  IMPRESSION: 1. At L4-5 there is mild bilateral facet arthropathy. 2. At L1-2 there is a small left paracentral disc protrusion without nerve root impingement. 3. At L5-S1 there is disc height loss and a small central disc protrusion. No foraminal stenosis.   Electronically Signed   By: Elige Ko   On: 07/18/2017 11:26   PMFS History: Patient Active Problem List   Diagnosis Date Noted  . Chronic right-sided low back pain without sciatica 04/20/2016   Past Medical History:  Diagnosis Date  . H/O renal calculi 2009/08/23   right side and passed on her own    Family History  Problem Relation Age of Onset  . Cancer Paternal Grandmother        thyroid  . Hyperlipidemia Father     No past surgical history on file. Social History   Occupational History  . Not on file  Tobacco Use  . Smoking status: Never Smoker  . Smokeless tobacco: Never Used  Substance and Sexual Activity  . Alcohol use: Yes    Alcohol/week: 0.0 - 1.0 oz  . Drug use: No  . Sexual activity: Yes    Partners: Male    Birth control/protection: Pill, Condom

## 2017-08-16 ENCOUNTER — Ambulatory Visit: Payer: 59 | Admitting: Certified Nurse Midwife

## 2017-08-16 ENCOUNTER — Ambulatory Visit: Payer: 59 | Admitting: Nurse Practitioner

## 2017-08-22 ENCOUNTER — Other Ambulatory Visit: Payer: Self-pay

## 2017-08-22 ENCOUNTER — Ambulatory Visit (INDEPENDENT_AMBULATORY_CARE_PROVIDER_SITE_OTHER): Payer: 59 | Admitting: Certified Nurse Midwife

## 2017-08-22 ENCOUNTER — Encounter: Payer: Self-pay | Admitting: Certified Nurse Midwife

## 2017-08-22 VITALS — BP 110/70 | HR 72 | Resp 16 | Ht 68.5 in | Wt 176.0 lb

## 2017-08-22 DIAGNOSIS — Z3041 Encounter for surveillance of contraceptive pills: Secondary | ICD-10-CM | POA: Diagnosis not present

## 2017-08-22 DIAGNOSIS — Z01419 Encounter for gynecological examination (general) (routine) without abnormal findings: Secondary | ICD-10-CM

## 2017-08-22 MED ORDER — DROSPIRENONE-ETHINYL ESTRADIOL 3-0.02 MG PO TABS: 1.0000 | ORAL_TABLET | Freq: Every day | ORAL | 3 refills | Status: DC

## 2017-08-22 NOTE — Patient Instructions (Signed)
General topics  Next pap or exam is  due in 1 year Take a Women's multivitamin Take 1200 mg. of calcium daily - prefer dietary If any concerns in interim to call back  Breast Self-Awareness Practicing breast self-awareness may pick up problems early, prevent significant medical complications, and possibly save your life. By practicing breast self-awareness, you can become familiar with how your breasts look and feel and if your breasts are changing. This allows you to notice changes early. It can also offer you some reassurance that your breast health is good. One way to learn what is normal for your breasts and whether your breasts are changing is to do a breast self-exam. If you find a lump or something that was not present in the past, it is best to contact your caregiver right away. Other findings that should be evaluated by your caregiver include nipple discharge, especially if it is bloody; skin changes or reddening; areas where the skin seems to be pulled in (retracted); or new lumps and bumps. Breast pain is seldom associated with cancer (malignancy), but should also be evaluated by a caregiver. BREAST SELF-EXAM The best time to examine your breasts is 5 7 days after your menstrual period is over.  ExitCare Patient Information 2013 [employer].   Exercise to Stay Healthy Exercise helps you become and stay healthy. EXERCISE IDEAS AND TIPS Choose exercises that:  You enjoy.  Fit into your day. You do not need to exercise really hard to be healthy. You can do exercises at a slow or medium level and stay healthy. You can:  Stretch before and after working out.  Try yoga, Pilates, or tai chi.  Lift weights.  Walk fast, swim, jog, run, climb stairs, bicycle, dance, or rollerskate.  Take aerobic classes. Exercises that burn about 150 calories:  Running 1  miles in 15 minutes.  Playing volleyball for 45 to 60 minutes.  Washing and waxing a car for 45 to 60  minutes.  Playing touch football for 45 minutes.  Walking 1  miles in 35 minutes.  Pushing a stroller 1  miles in 30 minutes.  Playing basketball for 30 minutes.  Raking leaves for 30 minutes.  Bicycling 5 miles in 30 minutes.  Walking 2 miles in 30 minutes.  Dancing for 30 minutes.  Shoveling snow for 15 minutes.  Swimming laps for 20 minutes.  Walking up stairs for 15 minutes.  Bicycling 4 miles in 15 minutes.  Gardening for 30 to 45 minutes.  Jumping rope for 15 minutes.  Washing windows or floors for 45 to 60 minutes. Document Released: 04/30/2010 Document Revised: 06/20/2011 Document Reviewed: 04/30/2010 ExitCare Patient Information 2013 [employer].   Other topics ( that may be useful information):    Sexually Transmitted Disease Sexually transmitted disease (STD) refers to any infection that is passed from person to person during sexual activity. This may happen by way of saliva, semen, blood, vaginal mucus, or urine. Common STDs include:  Gonorrhea.  Chlamydia.  Syphilis.  HIV/AIDS.  Genital herpes.  Hepatitis B and C.  Trichomonas.  Human papillomavirus (HPV).  Pubic lice. CAUSES  An STD may be spread by bacteria, virus, or parasite. A person can get an STD by:  Sexual intercourse with an infected person.  Sharing sex toys with an infected person.  Sharing needles with an infected person.  Having intimate contact with the genitals, mouth, or rectal areas of an infected person. SYMPTOMS  Some people may not have any symptoms, but   they can still pass the infection to others. Different STDs have different symptoms. Symptoms include:  Painful or bloody urination.  Pain in the pelvis, abdomen, vagina, anus, throat, or eyes.  Skin rash, itching, irritation, growths, or sores (lesions). These usually occur in the genital or anal area.  Abnormal vaginal discharge.  Penile discharge in men.  Soft, flesh-colored skin growths in the  genital or anal area.  Fever.  Pain or bleeding during sexual intercourse.  Swollen glands in the groin area.  Yellow skin and eyes (jaundice). This is seen with hepatitis. DIAGNOSIS  To make a diagnosis, your caregiver may:  Take a medical history.  Perform a physical exam.  Take a specimen (culture) to be examined.  Examine a sample of discharge under a microscope.  Perform blood test TREATMENT   Chlamydia, gonorrhea, trichomonas, and syphilis can be cured with antibiotic medicine.  Genital herpes, hepatitis, and HIV can be treated, but not cured, with prescribed medicines. The medicines will lessen the symptoms.  Genital warts from HPV can be treated with medicine or by freezing, burning (electrocautery), or surgery. Warts may come back.  HPV is a virus and cannot be cured with medicine or surgery.However, abnormal areas may be followed very closely by your caregiver and may be removed from the cervix, vagina, or vulva through office procedures or surgery. If your diagnosis is confirmed, your recent sexual partners need treatment. This is true even if they are symptom-free or have a negative culture or evaluation. They should not have sex until their caregiver says it is okay. HOME CARE INSTRUCTIONS  All sexual partners should be informed, tested, and treated for all STDs.  Take your antibiotics as directed. Finish them even if you start to feel better.  Only take over-the-counter or prescription medicines for pain, discomfort, or fever as directed by your caregiver.  Rest.  Eat a balanced diet and drink enough fluids to keep your urine clear or pale yellow.  Do not have sex until treatment is completed and you have followed up with your caregiver. STDs should be checked after treatment.  Keep all follow-up appointments, Pap tests, and blood tests as directed by your caregiver.  Only use latex condoms and water-soluble lubricants during sexual activity. Do not use  petroleum jelly or oils.  Avoid alcohol and illegal drugs.  Get vaccinated for HPV and hepatitis. If you have not received these vaccines in the past, talk to your caregiver about whether one or both might be right for you.  Avoid risky sex practices that can break the skin. The only way to avoid getting an STD is to avoid all sexual activity.Latex condoms and dental dams (for oral sex) will help lessen the risk of getting an STD, but will not completely eliminate the risk. SEEK MEDICAL CARE IF:   You have a fever.  You have any new or worsening symptoms. Document Released: 06/18/2002 Document Revised: 06/20/2011 Document Reviewed: 06/25/2010 [facility] Patient Information 2013 [place].    Domestic Abuse You are being battered or abused if someone close to you hits, pushes, or physically hurts you in any way. You also are being abused if you are forced into activities. You are being sexually abused if you are forced to have sexual contact of any kind. You are being emotionally abused if you are made to feel worthless or if you are constantly threatened. It is important to remember that help is available. No one has the right to abuse you. PREVENTION OF FURTHER  ABUSE  Learn the warning signs of danger. This varies with situations but may include: the use of alcohol, threats, isolation from friends and family, or forced sexual contact. Leave if you feel that violence is going to occur.  If you are attacked or beaten, report it to the police so the abuse is documented. You do not have to press charges. The police can protect you while you or the attackers are leaving. Get the officer's name and badge number and a copy of the report.  Find someone you can trust and tell them what is happening to you: your caregiver, a nurse, clergy member, close friend or family member. Feeling ashamed is natural, but remember that you have done nothing wrong. No one deserves abuse. Document Released:  03/25/2000 Document Revised: 06/20/2011 Document Reviewed: 06/03/2010 ExitCare Patient Information 2013 [employer].    How Much is Too Much Alcohol? Drinking too much alcohol can cause injury, accidents, and health problems. These types of problems can include:   Car crashes.  Falls.  Family fighting (domestic violence).  Drowning.  Fights.  Injuries.  Burns.  Damage to certain organs.  Having a baby with birth defects. ONE DRINK CAN BE TOO MUCH WHEN YOU ARE:  Working.  Pregnant or breastfeeding.  Taking medicines. Ask your doctor.  Driving or planning to drive. If you or someone you know has a drinking problem, get help from a doctor.  Document Released: 01/22/2009 Document Revised: 06/20/2011 Document Reviewed: 01/22/2009 ExitCare Patient Information 2013 [employer].   Smoking Hazards Smoking cigarettes is extremely bad for your health. Tobacco smoke has over 200 known poisons in it. There are over 60 chemicals in tobacco smoke that cause cancer. Some of the chemicals found in cigarette smoke include:   Cyanide.  Benzene.  Formaldehyde.  Methanol (wood alcohol).  Acetylene (fuel used in welding torches).  Ammonia. Cigarette smoke also contains the poisonous gases nitrogen oxide and carbon monoxide.  Cigarette smokers have an increased risk of many serious medical problems and Smoking causes approximately:  90% of all lung cancer deaths in men.  80% of all lung cancer deaths in women.  90% of deaths from chronic obstructive lung disease. Compared with nonsmokers, smoking increases the risk of:  Coronary heart disease by 2 to 4 times.  Stroke by 2 to 4 times.  Men developing lung cancer by 23 times.  Women developing lung cancer by 13 times.  Dying from chronic obstructive lung diseases by 12 times.  . Smoking is the most preventable cause of death and disease in our society.  WHY IS SMOKING ADDICTIVE?  Nicotine is the chemical  agent in tobacco that is capable of causing addiction or dependence.  When you smoke and inhale, nicotine is absorbed rapidly into the bloodstream through your lungs. Nicotine absorbed through the lungs is capable of creating a powerful addiction. Both inhaled and non-inhaled nicotine may be addictive.  Addiction studies of cigarettes and spit tobacco show that addiction to nicotine occurs mainly during the teen years, when young people begin using tobacco products. WHAT ARE THE BENEFITS OF QUITTING?  There are many health benefits to quitting smoking.   Likelihood of developing cancer and heart disease decreases. Health improvements are seen almost immediately.  Blood pressure, pulse rate, and breathing patterns start returning to normal soon after quitting. QUITTING SMOKING   American Lung Association - 1-800-LUNGUSA  American Cancer Society - 1-800-ACS-2345 Document Released: 05/05/2004 Document Revised: 06/20/2011 Document Reviewed: 01/07/2009 ExitCare Patient Information 2013 ExitCare,   LLC.   Stress Management Stress is a state of physical or mental tension that often results from changes in your life or normal routine. Some common causes of stress are:  Death of a loved one.  Injuries or severe illnesses.  Getting fired or changing jobs.  Moving into a new home. Other causes may be:  Sexual problems.  Business or financial losses.  Taking on a large debt.  Regular conflict with someone at home or at work.  Constant tiredness from lack of sleep. It is not just bad things that are stressful. It may be stressful to:  Win the lottery.  Get married.  Buy a new car. The amount of stress that can be easily tolerated varies from person to person. Changes generally cause stress, regardless of the types of change. Too much stress can affect your health. It may lead to physical or emotional problems. Too little stress (boredom) may also become stressful. SUGGESTIONS TO  REDUCE STRESS:  Talk things over with your family and friends. It often is helpful to share your concerns and worries. If you feel your problem is serious, you may want to get help from a professional counselor.  Consider your problems one at a time instead of lumping them all together. Trying to take care of everything at once may seem impossible. List all the things you need to do and then start with the most important one. Set a goal to accomplish 2 or 3 things each day. If you expect to do too many in a single day you will naturally fail, causing you to feel even more stressed.  Do not use alcohol or drugs to relieve stress. Although you may feel better for a short time, they do not remove the problems that caused the stress. They can also be habit forming.  Exercise regularly - at least 3 times per week. Physical exercise can help to relieve that "uptight" feeling and will relax you.  The shortest distance between despair and hope is often a good night's sleep.  Go to bed and get up on time allowing yourself time for appointments without being rushed.  Take a short "time-out" period from any stressful situation that occurs during the day. Close your eyes and take some deep breaths. Starting with the muscles in your face, tense them, hold it for a few seconds, then relax. Repeat this with the muscles in your neck, shoulders, hand, stomach, back and legs.  Take good care of yourself. Eat a balanced diet and get plenty of rest.  Schedule time for having fun. Take a break from your daily routine to relax. HOME CARE INSTRUCTIONS   Call if you feel overwhelmed by your problems and feel you can no longer manage them on your own.  Return immediately if you feel like hurting yourself or someone else. Document Released: 09/21/2000 Document Revised: 06/20/2011 Document Reviewed: 05/14/2007 [facility] Patient Information 2013 [employer].   Oral Contraception Use Oral contraceptive pills  (OCPs) are medicines taken to prevent pregnancy. OCPs work by preventing the ovaries from releasing eggs. The hormones in OCPs also cause the cervical mucus to thicken, preventing the sperm from entering the uterus. The hormones also cause the uterine lining to become thin, not allowing a fertilized egg to attach to the inside of the uterus. OCPs are highly effective when taken exactly as prescribed. However, OCPs do not prevent sexually transmitted diseases (STDs). Safe sex practices, such as using condoms along with an OCP, can help prevent STDs.  Before taking OCPs, you may have a physical exam and Pap test. Your health care provider may also order blood tests if necessary. Your health care provider will make sure you are a good candidate for oral contraception. Discuss with your health care provider the possible side effects of the OCP you may be prescribed. When starting an OCP, it can take 2 to 3 months for the body to adjust to the changes in hormone levels in your body. How to take oral contraceptive pills Your health care provider may advise you on how to start taking the first cycle of OCPs. Otherwise, you can:  Start on day 1 of your menstrual period. You will not need any backup contraceptive protection with this start time.  Start on the first Sunday after your menstrual period or the day you get your prescription. In these cases, you will need to use backup contraceptive protection for the first week.  Start the pill at any time of your cycle. If you take the pill within 5 days of the start of your period, you are protected against pregnancy right away. In this case, you will not need a backup form of birth control. If you start at any other time of your menstrual cycle, you will need to use another form of birth control for 7 days. If your OCP is the type called a minipill, it will protect you from pregnancy after taking it for 2 days (48 hours).  After you have started taking OCPs:  If  you forget to take 1 pill, take it as soon as you remember. Take the next pill at the regular time.  If you miss 2 or more pills, call your health care provider because different pills have different instructions for missed doses. Use backup birth control until your next menstrual period starts.  If you use a 28-day pack that contains inactive pills and you miss 1 of the last 7 pills (pills with no hormones), it will not matter. Throw away the rest of the non-hormone pills and start a new pill pack.  No matter which day you start the OCP, you will always start a new pack on that same day of the week. Have an extra pack of OCPs and a backup contraceptive method available in case you miss some pills or lose your OCP pack. Follow these instructions at home:  Do not smoke.  Always use a condom to protect against STDs. OCPs do not protect against STDs.  Use a calendar to mark your menstrual period days.  Read the information and directions that came with your OCP. Talk to your health care provider if you have questions. Contact a health care provider if:  You develop nausea and vomiting.  You have abnormal vaginal discharge or bleeding.  You develop a rash.  You miss your menstrual period.  You are losing your hair.  You need treatment for mood swings or depression.  You get dizzy when taking the OCP.  You develop acne from taking the OCP.  You become pregnant. Get help right away if:  You develop chest pain.  You develop shortness of breath.  You have an uncontrolled or severe headache.  You develop numbness or slurred speech.  You develop visual problems.  You develop pain, redness, and swelling in the legs. This information is not intended to replace advice given to you by your health care provider. Make sure you discuss any questions you have with your health care provider. Document Released: 03/17/2011  Document Revised: 09/03/2015 Document Reviewed:  09/16/2012 Elsevier Interactive Patient Education  2017 [employer]vier Inc.  

## 2017-08-22 NOTE — Progress Notes (Signed)
27 y.o. G0P0 Single  Caucasian Fe here for annual exam. Periods normal, no issues. Occasional missed pills, but takes as soon as she remembers. No partner change or STD screening.  Sees Urgent care if needed. No health issues today.  Patient's last menstrual period was 08/16/2017 (exact date).          Sexually active: Yes.    The current method of family planning is OCP (estrogen/progesterone).    Exercising: Yes.    pure barre Smoker:  no  Health Maintenance: Pap:  06-27-14 neg, 08-15-16 neg (endometrial cells present) History of Abnormal Pap: no MMG:  none Self Breast exams: occ Colonoscopy:  none BMD:   none TDaP:  08/31/2017 Shingles: no Pneumonia: no Hep C and HIV: not done Labs: yes   reports that she has never smoked. She has never used smokeless tobacco. She reports that she drinks about 0.6 - 1.2 oz of alcohol per week. She reports that she does not use drugs.  Past Medical History:  Diagnosis Date  . H/O renal calculi 08/31/09   right side and passed on her own    History reviewed. No pertinent surgical history.  Current Outpatient Medications  Medication Sig Dispense Refill  . drospirenone-ethinyl estradiol (YAZ,GIANVI,LORYNA) 3-0.02 MG tablet Take 1 tablet by mouth daily. 3 Package 4  . Multiple Vitamin (MULTIVITAMIN) tablet Take 1 tablet by mouth.     . Probiotic Product (PROBIOTIC PO) Take by mouth.     No current facility-administered medications for this visit.     Family History  Problem Relation Age of Onset  . Cancer Paternal Grandmother        thyroid  . Hyperlipidemia Father     ROS:  Pertinent items are noted in HPI.  Otherwise, a comprehensive ROS was negative.  Exam:   BP 110/70   Pulse 72   Resp 16   Ht 5' 8.5" (1.74 m)   Wt 176 lb (79.8 kg)   LMP 08/16/2017 (Exact Date)   BMI 26.37 kg/m  Height: 5' 8.5" (174 cm) Ht Readings from Last 3 Encounters:  08/22/17 5' 8.5" (1.74 m)  07/21/17  (1.753 m)  06/28/17  (1.753 m)    General  appearance: alert, cooperative and appears stated age Head: Normocephalic, without obvious abnormality, atraumatic Neck: no adenopathy, supple, symmetrical, trachea midline and thyroid normal to inspection and palpation Lungs: clear to auscultation bilaterally Breasts: normal appearance, no masses or tenderness, No nipple retraction or dimpling, No nipple discharge or bleeding, No axillary or supraclavicular adenopathy Heart: regular rate and rhythm Abdomen: soft, non-tender; no masses,  no organomegaly Extremities: extremities normal, atraumatic, no cyanosis or edema Skin: Skin color, texture, turgor normal. No rashes or lesions Lymph nodes: Cervical, supraclavicular, and axillary nodes normal. No abnormal inguinal nodes palpated Neurologic: Grossly normal   Pelvic: External genitalia:  no lesions              Urethra:  normal appearing urethra with no masses, tenderness or lesions              Bartholin's and Skene's: normal                 Vagina: normal appearing vagina with normal color and discharge, no lesions              Cervix: no cervical motion tenderness, no lesions and nulliparous appearance              Pap taken: Yes.  Bimanual Exam:  Uterus:  normal size, contour, position, consistency, mobility, non-tender and anteverted              Adnexa: normal adnexa and no mass, fullness, tenderness               Rectovaginal: Confirms               Anus:  normal sphincter tone, no lesions  Chaperone present: yes  A:  Well Woman with normal exam  Contraception OCP desired  P:   Reviewed health and wellness pertinent to exam  Risks/benefits/warning signs given desires OCP again  Rx Yaz see order with instructions  Pap smear: no  counseled on breast self exam, STD prevention, HIV risk factors and prevention, use and side effects of OCP's, adequate intake of calcium and vitamin D, diet and exercise return annually or prn  An After Visit Summary was printed and given to the  patient.

## 2018-01-18 DIAGNOSIS — Z23 Encounter for immunization: Secondary | ICD-10-CM | POA: Diagnosis not present

## 2018-08-17 IMAGING — MR MR LUMBAR SPINE W/O CM
4 of 5 series · 26 of 48 positions shown · non-contrast
Comparison: None.

CLINICAL DATA: Chronic low back pain for 8 years

EXAM:
MRI LUMBAR SPINE WITHOUT CONTRAST
TECHNIQUE: Multiplanar, multisequence MR imaging of the lumbar spine was
performed. No intravenous contrast was administered.

[Series 3: T2 post-contrast · sagittal · 4.0mm · 0.55mm/px · 6 of 13 slices shown]
[im 1/13]
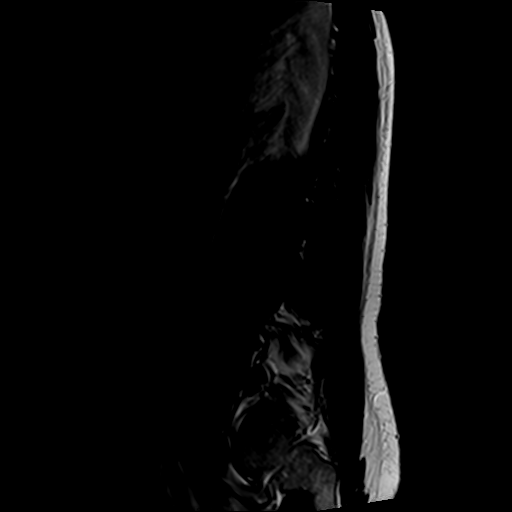
[im 3/13]
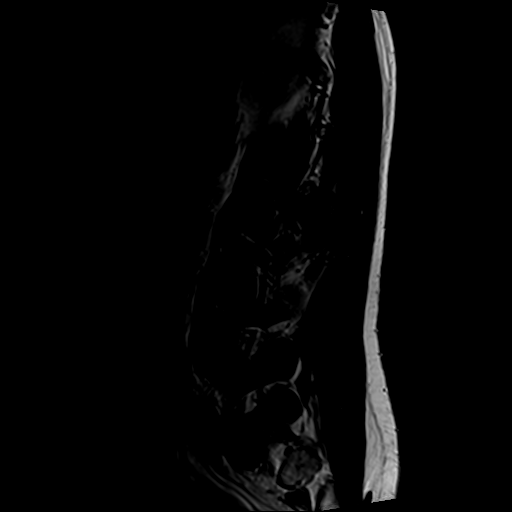
[im 5/13]
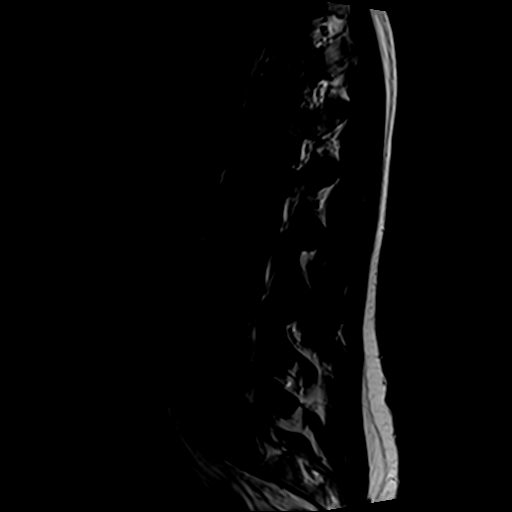
[im 8/13]
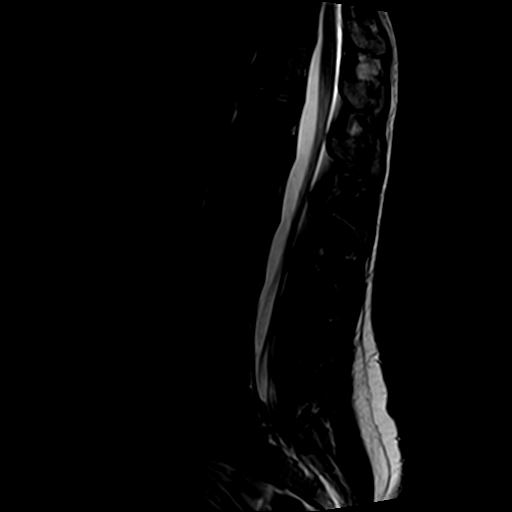
[im 10/13]
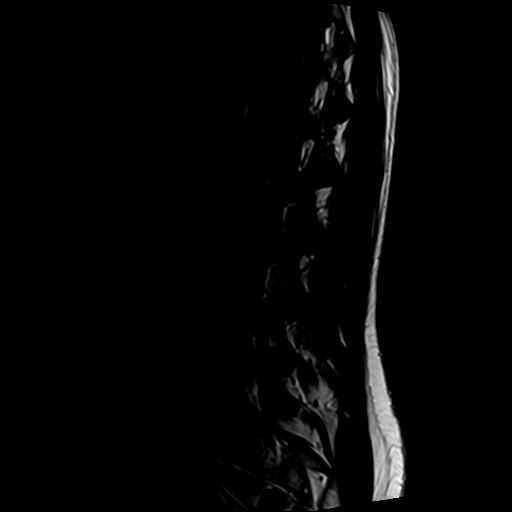
[im 13/13]
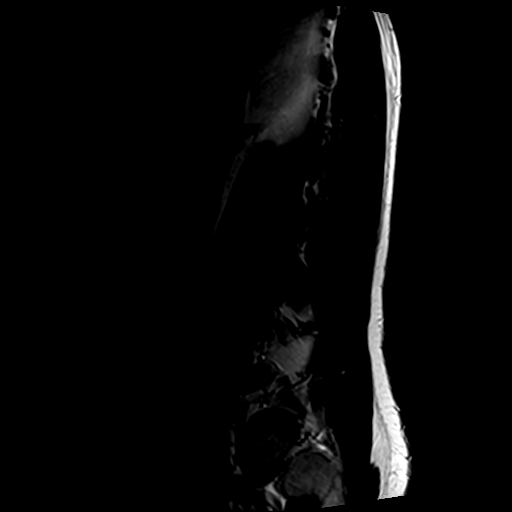

[Series 5: T1 · sagittal · 4.0mm · 0.55mm/px · 5 of 13 slices shown (1 of 2)]
[im 1/13]
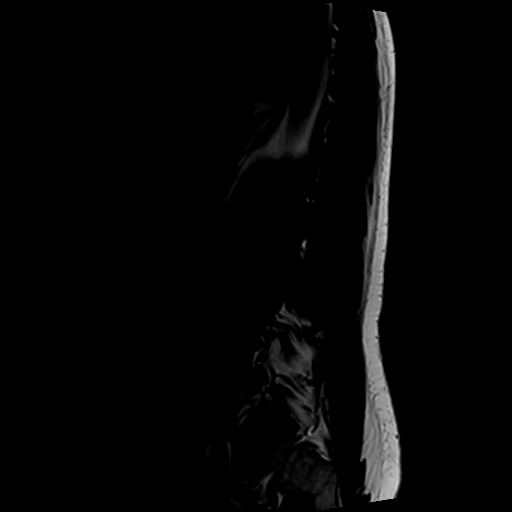
[im 4/13]
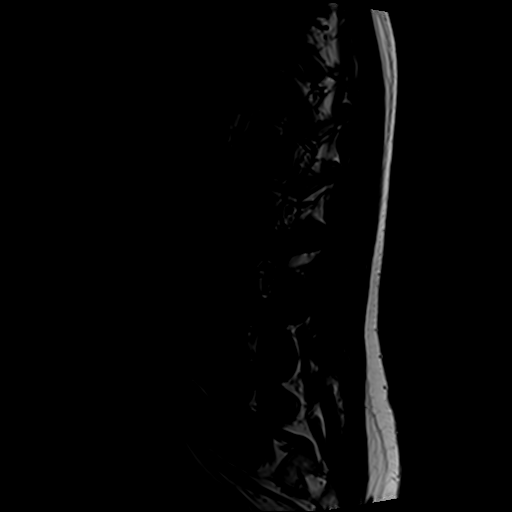
[im 7/13]
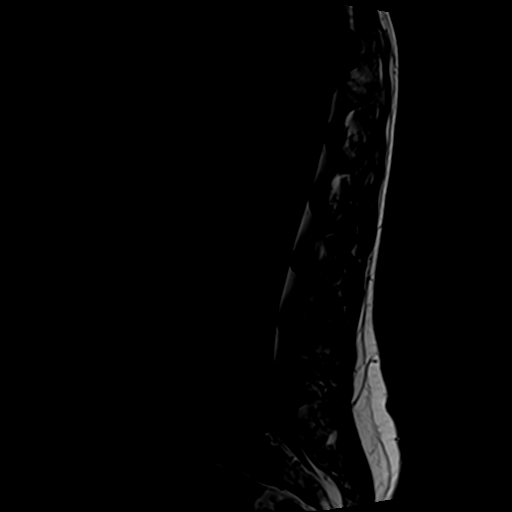
[im 10/13]
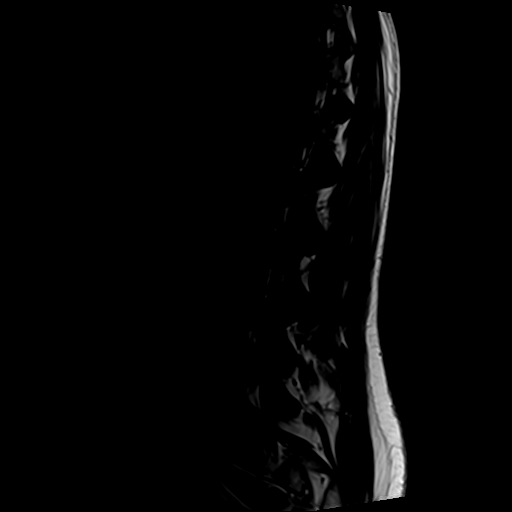
[im 13/13]
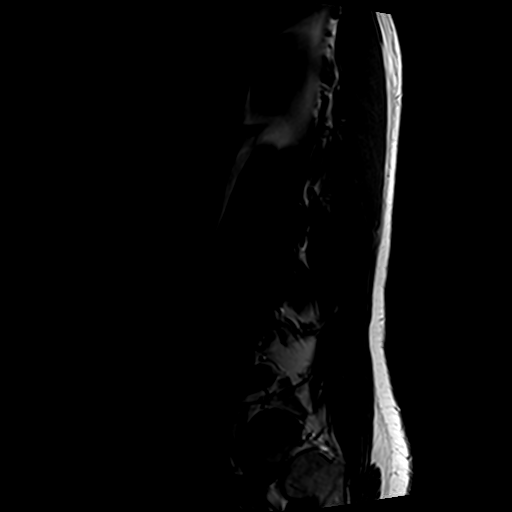

[Series 6: T1 · axial · 4.0mm · 0.35mm/px · z∈[-118,+69]mm · 5 of 38 slices shown (2 of 2)]
[im 3/38]
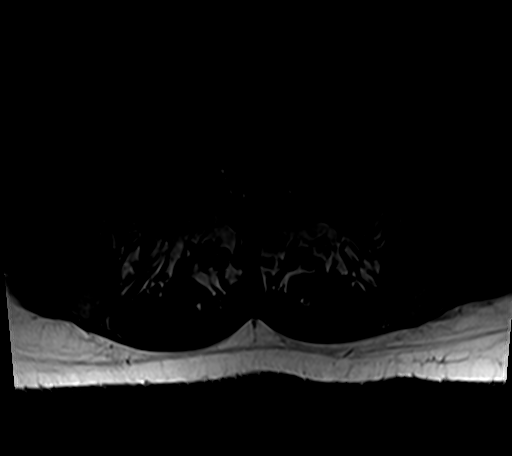
[im 5/38]
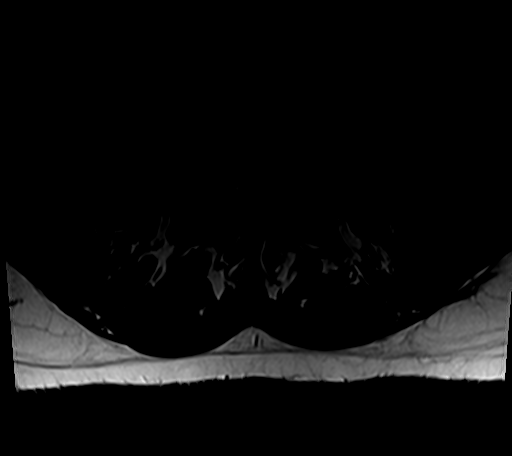
[im 8/38]
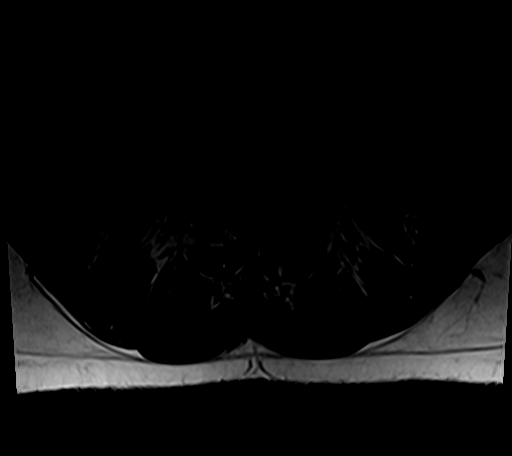
[im 20/38]
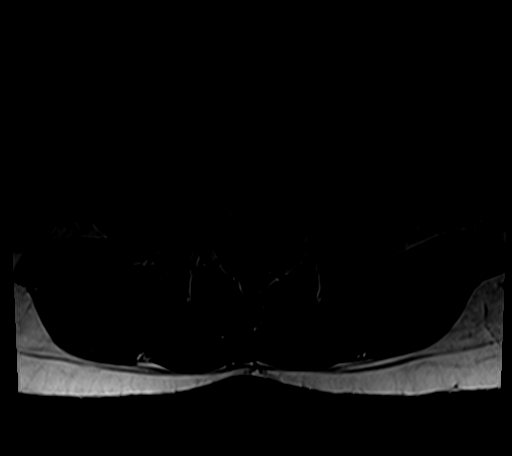
[im 33/38]
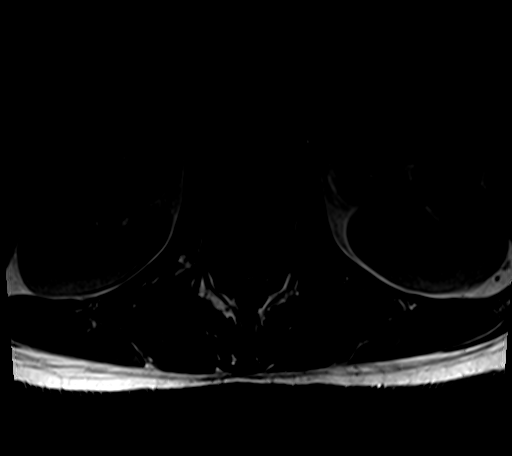

[Series 7: T2 · axial · 4.0mm · 0.70mm/px · z∈[-118,+94]mm · 10 of 38 slices shown]
[im 3/38]
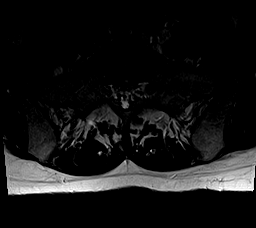
[im 5/38]
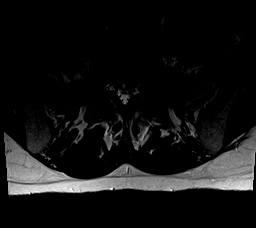
[im 8/38]
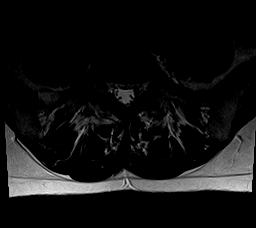
[im 13/38]
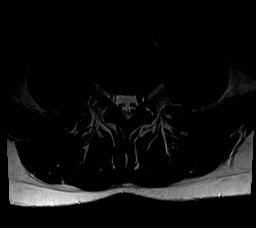
[im 18/38]
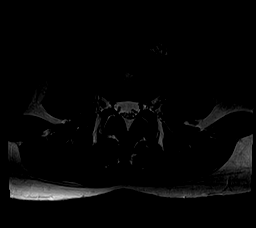
[im 20/38]
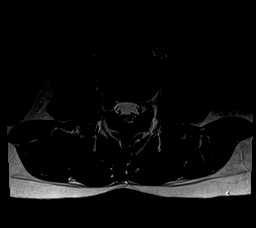
[im 23/38]
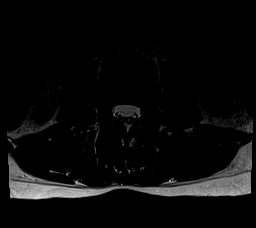
[im 28/38]
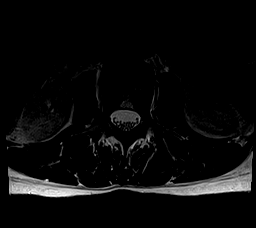
[im 33/38]
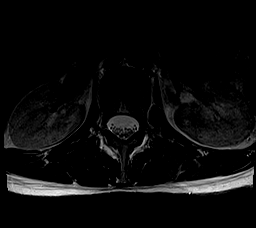
[im 38/38]
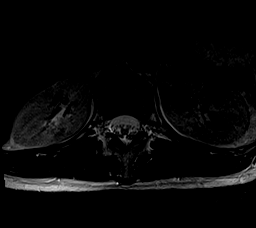

[26 of 48 positions shown; findings below may reference images not displayed]

FINDINGS: Segmentation:  Standard.

Alignment:  Physiologic.

Vertebrae: No fracture, evidence of discitis, or bone lesion.
Schmorl's node along the inferior endplate of L1 and L2.

Conus medullaris and cauda equina: Conus extends to the T12 level.
Conus and cauda equina appear normal.

Paraspinal and other soft tissues: No acute paraspinal abnormality.

Disc levels:

Disc spaces: Degenerative disc disease with disc desiccation and
disc height loss at L5-S1.

T12-L1: No significant disc bulge. No evidence of neural foraminal
stenosis. No central canal stenosis.

L1-L2: Small left paracentral disc protrusion. No evidence of neural
foraminal stenosis. No central canal stenosis.

L2-L3: No significant disc bulge. No evidence of neural foraminal
stenosis. No central canal stenosis.

L3-L4: No significant disc bulge. No evidence of neural foraminal
stenosis. No central canal stenosis.

L4-L5: No significant disc bulge. No evidence of neural foraminal
stenosis. No central canal stenosis. Mild bilateral facet
arthropathy.

L5-S1: Small central disc protrusion. Mild bilateral facet
arthropathy. No evidence of neural foraminal stenosis. No central
canal stenosis.
IMPRESSION: 1. At L4-5 there is mild bilateral facet arthropathy.
2. At L1-2 there is a small left paracentral disc protrusion without
nerve root impingement.
3. At L5-S1 there is disc height loss and a small central disc
protrusion. No foraminal stenosis.

## 2018-08-24 ENCOUNTER — Ambulatory Visit: Payer: 59 | Admitting: Certified Nurse Midwife

## 2018-09-23 ENCOUNTER — Other Ambulatory Visit: Payer: Self-pay | Admitting: Certified Nurse Midwife

## 2018-09-23 DIAGNOSIS — Z3041 Encounter for surveillance of contraceptive pills: Secondary | ICD-10-CM

## 2018-09-24 NOTE — Telephone Encounter (Signed)
Patient called and scheduled annual exam for 10/22/2018 at 2pm.

## 2018-09-24 NOTE — Telephone Encounter (Signed)
Medication refill request: OCP Last AEX:  08-22-17 DL  Next AEX: detailed message left for patient to call and schedule  Last MMG (if hormonal medication request): n/a Refill authorized: today, please advise.   Medication pended for #28, 0RF. Please refill if appropriate.

## 2018-10-21 ENCOUNTER — Other Ambulatory Visit: Payer: Self-pay | Admitting: Certified Nurse Midwife

## 2018-10-21 DIAGNOSIS — Z3041 Encounter for surveillance of contraceptive pills: Secondary | ICD-10-CM

## 2018-10-22 ENCOUNTER — Other Ambulatory Visit: Payer: Self-pay

## 2018-10-22 ENCOUNTER — Encounter: Payer: Self-pay | Admitting: Certified Nurse Midwife

## 2018-10-22 ENCOUNTER — Other Ambulatory Visit (HOSPITAL_COMMUNITY)
Admission: RE | Admit: 2018-10-22 | Discharge: 2018-10-22 | Disposition: A | Discharge status: Home / Self Care | Payer: 59 | Source: Ambulatory Visit | Attending: Certified Nurse Midwife | Admitting: Certified Nurse Midwife

## 2018-10-22 ENCOUNTER — Ambulatory Visit (INDEPENDENT_AMBULATORY_CARE_PROVIDER_SITE_OTHER): Payer: 59 | Admitting: Certified Nurse Midwife

## 2018-10-22 VITALS — BP 106/70 | HR 64 | Temp 97.2°F | Resp 16 | Ht 68.5 in | Wt 185.0 lb

## 2018-10-22 DIAGNOSIS — Z01419 Encounter for gynecological examination (general) (routine) without abnormal findings: Secondary | ICD-10-CM | POA: Diagnosis not present

## 2018-10-22 DIAGNOSIS — Z3041 Encounter for surveillance of contraceptive pills: Secondary | ICD-10-CM

## 2018-10-22 DIAGNOSIS — Z124 Encounter for screening for malignant neoplasm of cervix: Secondary | ICD-10-CM | POA: Diagnosis present

## 2018-10-22 MED ORDER — DROSPIRENONE-ETHINYL ESTRADIOL 3-0.02 MG PO TABS: 1.0000 | ORAL_TABLET | Freq: Every day | ORAL | 4 refills | Status: DC

## 2018-10-22 NOTE — Progress Notes (Signed)
28 y.o. G0P0 Single  Caucasian Fe here for annual exam. Periods normal, no issues. Contraception OCP working well. Recent occurrence of poison ivy and currently being treated and feeling better. Has some weight gain with the Pandemic, but exercising. Had cat bite earlier with treatment. No partner change,no STD screening needed. No health issues today.  Patient's last menstrual period was 09/27/2018 (exact date).          Sexually active: Yes.    The current method of family planning is OCP (estrogen/progesterone).    Exercising: Yes.    hiking, pure barre Smoker:  no  Review of Systems  Constitutional: Negative.   HENT: Negative.   Eyes: Negative.   Respiratory: Negative.   Cardiovascular: Negative.   Gastrointestinal: Negative.   Genitourinary: Negative.   Musculoskeletal: Negative.   Skin: Negative.   Neurological: Negative.   Endo/Heme/Allergies: Negative.   Psychiatric/Behavioral: Negative.     Health Maintenance: Pap:  08-15-16 neg no endocervical cells, endometrial cells present History of Abnormal Pap: no MMG:  none Self Breast exams: yes Colonoscopy:  none BMD:   none TDaP:  2017/07/24 Shingles: no Pneumonia: no Hep C and HIV: not done Labs: yes   reports that she has never smoked. She has never used smokeless tobacco. She reports previous alcohol use. She reports that she does not use drugs.  Past Medical History:  Diagnosis Date  . H/O renal calculi 2009/07/24   right side and passed on her own    History reviewed. No pertinent surgical history.  Current Outpatient Medications  Medication Sig Dispense Refill  . Multiple Vitamin (MULTIVITAMIN) tablet Take 1 tablet by mouth.     Marland Kitchen NIKKI 3-0.02 MG tablet TAKE 1 TABLET BY MOUTH EVERY DAY 28 tablet 0  . Probiotic Product (PROBIOTIC PO) Take by mouth.    Marland Kitchen VITAMIN D PO Take by mouth.     No current facility-administered medications for this visit.     Family History  Problem Relation Age of Onset  . Cancer Paternal  Grandmother        thyroid  . Hyperlipidemia Father   . Brain cancer Maternal Aunt     ROS:  Pertinent items are noted in HPI.  Otherwise, a comprehensive ROS was negative.  Exam:   BP 106/70   Pulse 64   Temp (!) 97.2 F (36.2 C) (Skin)   Resp 16   Ht 5' 8.5" (1.74 m)   Wt 185 lb (83.9 kg)   LMP 09/27/2018 (Exact Date)   BMI 27.72 kg/m  Height: 5' 8.5" (174 cm) Ht Readings from Last 3 Encounters:  10/22/18 5' 8.5" (1.74 m)  08/22/17 5' 8.5" (1.74 m)  07/21/17 5\' 9"  (1.753 m)    General appearance: alert, cooperative and appears stated age Head: Normocephalic, without obvious abnormality, atraumatic Neck: no adenopathy, supple, symmetrical, trachea midline and thyroid normal to inspection and palpation Lungs: clear to auscultation bilaterally Breasts: normal appearance, no masses or tenderness, No nipple retraction or dimpling, No nipple discharge or bleeding, No axillary or supraclavicular adenopathy Heart: regular rate and rhythm Abdomen: soft, non-tender; no masses,  no organomegaly Extremities: extremities normal, atraumatic, no cyanosis or edema Skin: Skin color, texture, turgor normal. No rashes or lesions Lymph nodes: Cervical, supraclavicular, and axillary nodes normal. No abnormal inguinal nodes palpated Neurologic: Grossly normal   Pelvic: External genitalia:  no lesions              Urethra:  normal appearing urethra with no masses, tenderness  or lesions              Bartholin's and Skene's: normal                 Vagina: normal appearing vagina with normal color and discharge, no lesions              Cervix: no cervical motion tenderness, no lesions, nulliparous appearance and scant bleeding with pap              Pap taken: Yes.   Bimanual Exam:  Uterus:  normal size, contour, position, consistency, mobility, non-tender and anteverted              Adnexa: normal adnexa and no mass, fullness, tenderness               Rectovaginal: Confirms                Anus:  normal appearance, declined rectal exam  Chaperone present: yes  A:  Well Woman with normal exam  Contraception OCP desired  Recent poison ivy treatment  P:   Reviewed health and wellness pertinent to exam  Risks/benefits/warning signs reviewed with OCP use, denies any.   Rx Nikki see order with instructions  Pap smear: yes   counseled on breast self exam, STD prevention, HIV risk factors and prevention, use and side effects of OCP's, adequate intake of calcium and vitamin D, diet and exercise  return annually or prn  An After Visit Summary was printed and given to the patient.

## 2018-10-24 LAB — CYTOLOGY - PAP
Diagnosis: NEGATIVE
HPV: NOT DETECTED

## 2019-07-03 ENCOUNTER — Encounter: Payer: Self-pay | Admitting: Certified Nurse Midwife

## 2019-07-29 ENCOUNTER — Telehealth: Payer: Self-pay | Admitting: General Practice

## 2019-07-29 NOTE — Telephone Encounter (Signed)
Ok to schedule as new patient if family.

## 2019-07-29 NOTE — Telephone Encounter (Signed)
Caller Jean Burke  Call Back # [phone]  Subject : Patient looking to establish care with Dr Patsy Lager   Patient's sister and mother both see her as their PCP  Please advise

## 2019-10-23 ENCOUNTER — Ambulatory Visit: Payer: 59 | Admitting: Certified Nurse Midwife

## 2019-12-02 NOTE — Progress Notes (Signed)
29 y.o. G0P0 Single Caucasian female here for annual exam.    Likes her current birth control pill and wants to continue.   Had her Covid vaccine, completed July 11, 2019.   Same partner for 6 years.  Declines STD screening.   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 Fosters animals.   PCP:   None  Patient's last menstrual period was 11/29/2019 (exact date).     Period Cycle (Days): 30 Period Duration (Days): 4 days Period Pattern: Regular Menstrual Flow: Moderate Menstrual Control: Tampon Menstrual Control Change Freq (Hours): every 4 hours for cleanliness Dysmenorrhea: None     Sexually active: Yes.    The current method of family planning is OCP (estrogen/progesterone)--Nikki.    Exercising: Yes.    kickboxing and dogwalking Smoker:  no  Health Maintenance: Pap: 10-22-18 Neg:Neg HR HPV, 08-15-16 Neg, 06-27-14 Neg History of abnormal Pap:  no MMG:  n/a Colonoscopy:  n/a BMD:   n/a  Result  n/a TDaP: 05-04-17 Gardasil:   Yes, completed HIV: never Hep C:never Screening Labs:  Biometric screening at work.    reports that she has never smoked. She has never used smokeless tobacco. She reports previous alcohol use. She reports that she does not use drugs.  Past Medical History:  Diagnosis Date  . H/O renal calculi 16-Aug-2009   right side and passed on her own    Past Surgical History:  Procedure Laterality Date  . dental implants  02/2019, 06/2019   2 implants    Current Outpatient Medications  Medication Sig Dispense Refill  . drospirenone-ethinyl estradiol (NIKKI) 3-0.02 MG tablet Take 1 tablet by mouth daily. 3 Package 4  . Multiple Vitamin (MULTIVITAMIN) tablet Take 1 tablet by mouth.     Marland Kitchen VITAMIN D PO Take by mouth.     No current facility-administered medications for this visit.    Family History  Problem Relation Age of Onset  . Cancer Paternal Grandmother        thyroid  . Hyperlipidemia Father   . Brain cancer Maternal Aunt     Review of Systems  All  other systems reviewed and are negative.   Exam:   BP 118/76   Pulse 76   Resp 14   Ht 5' 8.5" (1.74 m)   Wt 180 lb (81.6 kg)   LMP 11/29/2019 (Exact Date)   BMI 26.97 kg/m     General appearance: alert, cooperative and appears stated age Head: normocephalic, without obvious abnormality, atraumatic Neck: no adenopathy, supple, symmetrical, trachea midline and thyroid normal to inspection and palpation Lungs: clear to auscultation bilaterally Breasts: normal appearance, no masses or tenderness, No nipple retraction or dimpling, No nipple discharge or bleeding, No axillary adenopathy Heart: regular rate and rhythm Abdomen: soft, non-tender; no masses, no organomegaly Extremities: extremities normal, atraumatic, no cyanosis or edema Skin: skin color, texture, turgor normal. No rashes or lesions Lymph nodes: cervical, supraclavicular, and axillary nodes normal. Neurologic: grossly normal  Pelvic: External genitalia:  no lesions              No abnormal inguinal nodes palpated.              Urethra:  normal appearing urethra with no masses, tenderness or lesions              Bartholins and Skenes: normal                 Vagina: normal appearing vagina with normal color and discharge,  no lesions              Cervix: no lesions.  Small amount of dark brown blood.               Pap taken: No. Bimanual Exam:  Uterus:  normal size, contour, position, consistency, mobility, non-tender              Adnexa: no mass, fullness, tenderness            Chaperone was present for exam.  Assessment:   Well woman visit with normal exam.   Plan: Mammogram screening discussed. Self breast awareness reviewed. Pap and HR HPV 2025. Guidelines for Calcium, Vitamin D, regular exercise program including cardiovascular and weight bearing exercise. Refill COCs for one year.  Follow up annually and prn.   After visit summary provided.

## 2019-12-03 ENCOUNTER — Encounter: Payer: Self-pay | Admitting: Obstetrics and Gynecology

## 2019-12-03 ENCOUNTER — Other Ambulatory Visit: Payer: Self-pay

## 2019-12-03 ENCOUNTER — Ambulatory Visit (INDEPENDENT_AMBULATORY_CARE_PROVIDER_SITE_OTHER): Payer: Commercial Managed Care - PPO | Admitting: Obstetrics and Gynecology

## 2019-12-03 DIAGNOSIS — Z3041 Encounter for surveillance of contraceptive pills: Secondary | ICD-10-CM

## 2019-12-03 MED ORDER — DROSPIRENONE-ETHINYL ESTRADIOL 3-0.02 MG PO TABS: 1.0000 | ORAL_TABLET | Freq: Every day | ORAL | 3 refills | Status: DC

## 2019-12-03 NOTE — Patient Instructions (Signed)

## 2020-11-09 ENCOUNTER — Other Ambulatory Visit: Payer: Self-pay | Admitting: Obstetrics and Gynecology

## 2020-11-09 DIAGNOSIS — Z3041 Encounter for surveillance of contraceptive pills: Secondary | ICD-10-CM

## 2020-12-08 ENCOUNTER — Ambulatory Visit: Payer: Commercial Managed Care - PPO | Admitting: Obstetrics and Gynecology

## 2020-12-08 ENCOUNTER — Ambulatory Visit (INDEPENDENT_AMBULATORY_CARE_PROVIDER_SITE_OTHER): Payer: Commercial Managed Care - PPO | Admitting: Obstetrics and Gynecology

## 2020-12-08 ENCOUNTER — Encounter: Payer: Self-pay | Admitting: Obstetrics and Gynecology

## 2020-12-08 ENCOUNTER — Other Ambulatory Visit: Payer: Self-pay

## 2020-12-08 DIAGNOSIS — Z01419 Encounter for gynecological examination (general) (routine) without abnormal findings: Secondary | ICD-10-CM

## 2020-12-08 DIAGNOSIS — Z3041 Encounter for surveillance of contraceptive pills: Secondary | ICD-10-CM | POA: Diagnosis not present

## 2020-12-08 MED ORDER — DROSPIRENONE-ETHINYL ESTRADIOL 3-0.02 MG PO TABS: 1.0000 | ORAL_TABLET | Freq: Every day | ORAL | 3 refills | Status: DC

## 2020-12-08 NOTE — Progress Notes (Signed)
30 y.o. G0P0 Single Caucasian female here for annual exam.    Likes her birth control pill.   Declines STD screening.   Works from home.  Traveled to [place] with her mother.   PCP:  None  Patient's last menstrual period was 12/01/2020 (exact date).     Period Cycle (Days): 30 Period Duration (Days): 3 Period Pattern: Regular Menstrual Flow: Light Menstrual Control: Tampon Dysmenorrhea: None     Sexually active: No.  The current method of family planning is OCP (estrogen/progesterone).    Exercising: Yes.     Kickboxing and pilates Smoker:  no  Health Maintenance: Pap: 10-22-18 Neg:Neg HR HPV, 08-15-16 neg no endocervical cells, endometrial cells present, 06-27-14 Neg History of abnormal Pap:  no MMG:  n/a Colonoscopy:  n/a BMD:   n/a  Result  n/a TDaP:  Aug 16, 2017 Gardasil:   yes HIV:no Hep C:no Screening Labs:  Declined.  Covid vaccines:  received her Covid booster.    reports that she has never smoked. She has never used smokeless tobacco. She reports that she does not currently use alcohol. She reports that she does not use drugs.  Past Medical History:  Diagnosis Date   H/O renal calculi 08-16-2009   right side and passed on her own    Past Surgical History:  Procedure Laterality Date   dental implants  02/2019, 06/2019   2 implants    Current Outpatient Medications  Medication Sig Dispense Refill   Multiple Vitamin (MULTIVITAMIN) tablet Take 1 tablet by mouth.      NIKKI 3-0.02 MG tablet TAKE 1 TABLET BY MOUTH EVERY DAY 84 tablet 3   Probiotic Product (PROBIOTIC-10 PO) Take 1 tablet by mouth daily.     No current facility-administered medications for this visit.    Family History  Problem Relation Age of Onset   Cancer Paternal Grandmother        thyroid   Hyperlipidemia Father    Brain cancer Maternal Aunt     Review of Systems  All other systems reviewed and are negative.  Exam:   BP 104/68   Pulse 88   Ht 5' 8.5" (1.74 m)   Wt 184 lb (83.5 kg)    LMP 12/01/2020 (Exact Date)   SpO2 98%   BMI 27.57 kg/m     General appearance: alert, cooperative and appears stated age Head: normocephalic, without obvious abnormality, atraumatic Neck: no adenopathy, supple, symmetrical, trachea midline and thyroid normal to inspection and palpation Lungs: clear to auscultation bilaterally Breasts: normal appearance, no masses or tenderness, No nipple retraction or dimpling, No nipple discharge or bleeding, No axillary adenopathy Heart: regular rate and rhythm Abdomen: soft, non-tender; no masses, no organomegaly Extremities: extremities normal, atraumatic, no cyanosis or edema Skin: skin color, texture, turgor normal. No rashes or lesions Lymph nodes: cervical, supraclavicular, and axillary nodes normal. Neurologic: grossly normal  Pelvic: External genitalia:  no lesions              No abnormal inguinal nodes palpated.              Urethra:  normal appearing urethra with no masses, tenderness or lesions              Bartholins and Skenes: normal                 Vagina: normal appearing vagina with normal color and discharge, no lesions              Cervix: no lesions  Pap taken: no. Bimanual Exam:  Uterus:  normal size, contour, position, consistency, mobility, non-tender              Adnexa: no mass, fullness, tenderness             Chaperone was present for exam:  Irving Burton, RN  Assessment:   Well woman visit with gynecologic exam.   Plan: Mammogram screening discussed. Self breast awareness reviewed. Pap and HR HPV 2025. Guidelines for Calcium, Vitamin D, regular exercise program including cardiovascular and weight bearing exercise. Refill of COCs for one year.  Follow up annually and prn.   After visit summary provided.

## 2020-12-08 NOTE — Patient Instructions (Signed)

## 2021-12-09 ENCOUNTER — Encounter: Payer: Self-pay | Admitting: Obstetrics and Gynecology

## 2021-12-09 ENCOUNTER — Ambulatory Visit (INDEPENDENT_AMBULATORY_CARE_PROVIDER_SITE_OTHER): Payer: BC Managed Care – PPO | Admitting: Obstetrics and Gynecology

## 2021-12-09 DIAGNOSIS — Z01419 Encounter for gynecological examination (general) (routine) without abnormal findings: Secondary | ICD-10-CM

## 2021-12-09 DIAGNOSIS — Z3041 Encounter for surveillance of contraceptive pills: Secondary | ICD-10-CM

## 2021-12-09 MED ORDER — DROSPIRENONE-ETHINYL ESTRADIOL 3-0.02 MG PO TABS: 1.0000 | ORAL_TABLET | Freq: Every day | ORAL | 3 refills | Status: DC

## 2021-12-09 NOTE — Progress Notes (Signed)
31 y.o. G0P0 Single Caucasian female here for annual exam.    On COCs.  Wants to continue.   No menstrual problems.   Not sexually active.   Traveling to [place] and then [place].  PCP: None  Patient's last menstrual period was 11/11/2021 (approximate).     Period Cycle (Days): 28 Period Pattern: Regular Menstrual Flow: Light Menstrual Control: Tampon Menstrual Control Change Freq (Hours): changes tampon every 3 hours on heaviest day Dysmenorrhea: None     Sexually active: No.  The current method of family planning is OCP (estrogen/progesterone).    Exercising: Yes.     Kickbox, pilates, walks dog, gardening Smoker:  no  Health Maintenance: Pap:  10-22-18 Neg:Neg HR HPV, 08-15-16 neg no endocervical cells, endometrial cells present, 06-27-14 Neg History of abnormal Pap:  no MMG:  n/a Colonoscopy:  n/a BMD:   n/a  Result  n/a TDaP:  Jul 19, 2017 Gardasil:   COMPLETED  HIV:NO Hep C:NO Screening Labs:  Declines.   Flu vaccine:  at work.    reports that she has never smoked. She has never used smokeless tobacco. She reports current alcohol use. She reports that she does not use drugs.  Past Medical History:  Diagnosis Date   H/O renal calculi 07/19/09   right side and passed on her own    Past Surgical History:  Procedure Laterality Date   dental implants  02/2019, 06/2019   2 implants    Current Outpatient Medications  Medication Sig Dispense Refill   drospirenone-ethinyl estradiol (NIKKI) 3-0.02 MG tablet Take 1 tablet by mouth daily. 84 tablet 3   Multiple Vitamin (MULTIVITAMIN) tablet Take 1 tablet by mouth.      Probiotic Product (PROBIOTIC-10 PO) Take 1 tablet by mouth daily.     No current facility-administered medications for this visit.    Family History  Problem Relation Age of Onset   Cancer Paternal Grandmother        thyroid   Hyperlipidemia Father    Brain cancer Maternal Aunt     Review of Systems  All other systems reviewed and are  negative.   Exam:   BP 120/72   Pulse 92   Ht 5\' 9"  (1.753 m)   Wt 182 lb (82.6 kg)   LMP 11/11/2021 (Approximate)   SpO2 98%   BMI 26.88 kg/m     General appearance: alert, cooperative and appears stated age Head: normocephalic, without obvious abnormality, atraumatic Neck: no adenopathy, supple, symmetrical, trachea midline and thyroid normal to inspection and palpation Lungs: clear to auscultation bilaterally Breasts: normal appearance, no masses or tenderness, No nipple retraction or dimpling, No nipple discharge or bleeding, No axillary adenopathy Heart: regular rate and rhythm Abdomen: soft, non-tender; no masses, no organomegaly Extremities: extremities normal, atraumatic, no cyanosis or edema Skin: skin color, texture, turgor normal. No rashes or lesions Lymph nodes: cervical, supraclavicular, and axillary nodes normal. Neurologic: grossly normal  Pelvic: External genitalia:  no lesions              No abnormal inguinal nodes palpated.              Urethra:  normal appearing urethra with no masses, tenderness or lesions              Bartholins and Skenes: normal                 Vagina: normal appearing vagina with normal color and discharge, no lesions  Cervix: no lesions              Pap taken: no Bimanual Exam:  Uterus:  normal size, contour, position, consistency, mobility, non-tender              Adnexa: no mass, fullness, tenderness               Chaperone was present for exam:  Babs Sciara, CMA  Assessment:   Well woman visit with gynecologic exam. On COCs.   Plan: Mammogram screening discussed. Self breast awareness reviewed. Pap and HR HPV 2025. Guidelines for Calcium, Vitamin D, regular exercise program including cardiovascular and weight bearing exercise. REfill of COCs for one year.   Follow up annually and prn.   After visit summary provided.

## 2021-12-09 NOTE — Patient Instructions (Signed)
EXERCISE AND DIET:  We recommended that you start or continue a regular exercise program for good health. Regular exercise means any activity that makes your heart beat faster and makes you sweat.  We recommend exercising at least 30 minutes per day at least 3 days a week, preferably 4 or 5.  We also recommend a diet low in fat and sugar.  Inactivity, poor dietary choices and obesity can cause diabetes, heart attack, stroke, and kidney damage, among others.    ALCOHOL AND SMOKING:  Women should limit their alcohol intake to no more than 7 drinks/beers/glasses of wine (combined, not each!) per week. Moderation of alcohol intake to this level decreases your risk of breast cancer and liver damage. And of course, no recreational drugs are part of a healthy lifestyle.  And absolutely no smoking or even second hand smoke. Most people know smoking can cause heart and lung diseases, but did you know it also contributes to weakening of your bones? Aging of your skin?  Yellowing of your teeth and nails?  CALCIUM AND VITAMIN D:  Adequate intake of calcium and Vitamin D are recommended.  The recommendations for exact amounts of these supplements seem to change often, but generally speaking 600 mg of calcium (either carbonate or citrate) and 800 units of Vitamin D per day seems prudent. Certain women may benefit from higher intake of Vitamin D.  If you are among these women, your doctor will have told you during your visit.    PAP SMEARS:  Pap smears, to check for cervical cancer or precancers,  have traditionally been done yearly, although recent scientific advances have shown that most women can have pap smears less often.  However, every woman still should have a physical exam from her gynecologist every year. It will include a breast check, inspection of the vulva and vagina to check for abnormal growths or skin changes, a visual exam of the cervix, and then an exam to evaluate the size and shape of the uterus and  ovaries.  And after 33 years of age, a rectal exam is indicated to check for rectal cancers. We will also provide age appropriate advice regarding health maintenance, like when you should have certain vaccines, screening for sexually transmitted diseases, bone density testing, colonoscopy, mammograms, etc.   MAMMOGRAMS:  All women over 40 years old should have a yearly mammogram. Many facilities now offer a "3D" mammogram, which may cost around $50 extra out of pocket. If possible,  we recommend you accept the option to have the 3D mammogram performed.  It both reduces the number of women who will be called back for extra views which then turn out to be normal, and it is better than the routine mammogram at detecting truly abnormal areas.    COLONOSCOPY:  Colonoscopy to screen for colon cancer is recommended for all women at age 50.  We know, you hate the idea of the prep.  We agree, BUT, having colon cancer and not knowing it is worse!!  Colon cancer so often starts as a polyp that can be seen and removed at colonscopy, which can quite literally save your life!  And if your first colonoscopy is normal and you have no family history of colon cancer, most women don't have to have it again for 10 years.  Once every ten years, you can do something that may end up saving your life, right?  We will be happy to help you get it scheduled when you are ready.    Be sure to check your insurance coverage so you understand how much it will cost.  It may be covered as a preventative service at no cost, but you should check your particular policy.    Calcium Content in Foods Calcium is the most abundant mineral in the body. Most of the body's calcium supply is stored in bones and teeth. Calcium helps many parts of the body function normally, including: Blood and blood vessels. Nerves. Hormones. Muscles. Bones and teeth. When your calcium stores are low, you may be at risk for low bone mass, bone loss, and broken bones  (fractures). When you get enough calcium, it helps to support strong bones and teeth throughout your life. Calcium is especially important for: Children during growth spurts. Girls during adolescence. Women who are pregnant or breastfeeding. Women after their menstrual cycle stops (postmenopause). Women whose menstrual cycle has stopped due to anorexia nervosa or regular intense exercise. People who cannot eat or digest dairy products. Vegans. Recommended daily amounts of calcium: Women (ages 19 to 50): 1,000 mg per day. Women (ages 51 and older): 1,200 mg per day. Men (ages 19 to 70): 1,000 mg per day. Men (ages 71 and older): 1,200 mg per day. Women (ages 9 to 18): 1,300 mg per day. Men (ages 9 to 18): 1,300 mg per day. General information Eat foods that are high in calcium. Try to get most of your calcium from food. Some people may benefit from taking calcium supplements. Check with your health care provider or diet and nutrition specialist (dietitian) before starting any calcium supplements. Calcium supplements may interact with certain medicines. Too much calcium may cause other health problems, such as constipation and kidney stones. For the body to absorb calcium, it needs vitamin D. Sources of vitamin D include: Skin exposure to direct sunlight. Foods, such as egg yolks, liver, mushrooms, saltwater fish, and fortified milk. Vitamin D supplements. Check with your health care provider or dietitian before starting any vitamin D supplements. What foods are high in calcium?  Foods that are high in calcium contain more than 100 milligrams per serving. Fruits Fortified orange juice or other fruit juice, 300 mg per 8 oz serving. Vegetables Collard greens, 360 mg per 8 oz serving. Kale, 100 mg per 8 oz serving. Bok choy, 160 mg per 8 oz serving. Grains Fortified ready-to-eat cereals, 100 to 1,000 mg per 8 oz serving. Fortified frozen waffles, 200 mg in 2 waffles. Oatmeal, 140 mg in  1 cup. Meats and other proteins Sardines, canned with bones, 325 mg per 3 oz serving. Salmon, canned with bones, 180 mg per 3 oz serving. Canned shrimp, 125 mg per 3 oz serving. Baked beans, 160 mg per 4 oz serving. Tofu, firm, made with calcium sulfate, 253 mg per 4 oz serving. Dairy Yogurt, plain, low-fat, 310 mg per 6 oz serving. Nonfat milk, 300 mg per 8 oz serving. American cheese, 195 mg per 1 oz serving. Cheddar cheese, 205 mg per 1 oz serving. Cottage cheese 2%, 105 mg per 4 oz serving. Fortified soy, rice, or almond milk, 300 mg per 8 oz serving. Mozzarella, part skim, 210 mg per 1 oz serving. The items listed above may not be a complete list of foods high in calcium. Actual amounts of calcium may be different depending on processing. Contact a dietitian for more information. What foods are lower in calcium? Foods that are lower in calcium contain 50 mg or less per serving. Fruits Apple, about 6 mg. Banana, about 12 mg.   Vegetables Lettuce, 19 mg per 2 oz serving. Tomato, about 11 mg. Grains Rice, 4 mg per 6 oz serving. Boiled potatoes, 14 mg per 8 oz serving. White bread, 6 mg per slice. Meats and other proteins Egg, 27 mg per 2 oz serving. Red meat, 7 mg per 4 oz serving. Chicken, 17 mg per 4 oz serving. Fish, cod, or trout, 20 mg per 4 oz serving. Dairy Cream cheese, regular, 14 mg per 1 Tbsp serving. Brie cheese, 50 mg per 1 oz serving. Parmesan cheese, 70 mg per 1 Tbsp serving. The items listed above may not be a complete list of foods lower in calcium. Actual amounts of calcium may be different depending on processing. Contact a dietitian for more information. Summary Calcium is an important mineral in the body because it affects many functions. Getting enough calcium helps support strong bones and teeth throughout your life. Try to get most of your calcium from food. Calcium supplements may interact with certain medicines. Check with your health care provider  or dietitian before starting any calcium supplements. This information is not intended to replace advice given to you by your health care provider. Make sure you discuss any questions you have with your health care provider. Document Revised: 07/24/2019 Document Reviewed: 07/24/2019 Elsevier Patient Education  2023 Elsevier Inc.  

## 2022-01-26 DIAGNOSIS — Z23 Encounter for immunization: Secondary | ICD-10-CM | POA: Diagnosis not present

## 2022-02-01 ENCOUNTER — Ambulatory Visit (INDEPENDENT_AMBULATORY_CARE_PROVIDER_SITE_OTHER): Payer: BC Managed Care – PPO | Admitting: Nurse Practitioner

## 2022-02-01 ENCOUNTER — Telehealth: Payer: Self-pay

## 2022-02-01 ENCOUNTER — Other Ambulatory Visit: Payer: Self-pay | Admitting: Nurse Practitioner

## 2022-02-01 ENCOUNTER — Encounter: Payer: Self-pay | Admitting: Nurse Practitioner

## 2022-02-01 VITALS — BP 110/76 | HR 68 | Resp 14 | Ht 69.0 in | Wt 175.0 lb

## 2022-02-01 DIAGNOSIS — R3915 Urgency of urination: Secondary | ICD-10-CM | POA: Diagnosis not present

## 2022-02-01 DIAGNOSIS — N200 Calculus of kidney: Secondary | ICD-10-CM

## 2022-02-01 DIAGNOSIS — B3731 Acute candidiasis of vulva and vagina: Secondary | ICD-10-CM | POA: Diagnosis not present

## 2022-02-01 DIAGNOSIS — R3129 Other microscopic hematuria: Secondary | ICD-10-CM

## 2022-02-01 LAB — URINALYSIS, COMPLETE W/RFL CULTURE: RBC / HPF: >=60 /HPF — AB (ref 0–2)

## 2022-02-01 MED ORDER — TAMSULOSIN HCL 0.4 MG PO CAPS: 0.4000 mg | ORAL_CAPSULE | Freq: Every day | ORAL | 0 refills | Status: DC

## 2022-02-01 MED ORDER — FLUCONAZOLE 150 MG PO TABS: 150.0000 mg | ORAL_TABLET | ORAL | 0 refills | Status: DC

## 2022-02-01 NOTE — Progress Notes (Signed)
   Acute Office Visit  Subjective:    Patient ID: Jean Burke, female    DOB: 1991/03/12, 31 y.o.   MRN: [id number]   HPI 31 y.o. presents today for urinary urgency that started yesterday. She has also had some pain with intercourse. Has been on Amoxicillin x 5 days for dog bites. Finished course this morning. Denies discharge or odor, urinary frequency, hematuria or flank pain. H/O kidney stone in July 26, 2009 that she passed on her own.    Review of Systems  Constitutional: Negative.   Genitourinary:  Positive for dyspareunia, dysuria, urgency and vaginal pain. Negative for flank pain, frequency, genital sores, hematuria and vaginal discharge.       Objective:    Physical Exam Constitutional:      Appearance: Normal appearance.  Abdominal:     Tenderness: There is no right CVA tenderness or left CVA tenderness.     BP 110/76   Pulse 68   Resp 14   Ht 5\' 9"  (1.753 m)   Wt 175 lb (79.4 kg)   LMP 01/04/2022   BMI 25.84 kg/m  Wt Readings from Last 3 Encounters:  02/01/22 175 lb (79.4 kg)  12/09/21 182 lb (82.6 kg)  12/08/20 184 lb (83.5 kg)        UA: negative leukocytes, negative nitrites, 3+ blood, dark yellow/cloudy. Microscopic: wbc 0-5, rbc >60, moderate bacteria, few calcium ox, moderate yeast (budding present)  Assessment & Plan:   Problem List Items Addressed This Visit   None Visit Diagnoses     Vulvovaginal candidiasis    -  Primary   Relevant Medications   fluconazole (DIFLUCAN) 150 MG tablet   Urinary urgency       Relevant Orders   Urinalysis,Complete w/RFL Culture (Completed)   Other microscopic hematuria          Plan: Yeast present in urinalysis - Diflucan 150 mg today and repeat in 3 days for total of 2 doses. + blood and calcium oxalate in urine. Reviewed possible kidney stone and signs. Going out of the country July 27, 2022 for 2 weeks. Recommend repeating UA in 4 weeks.      Tamela Gammon DNP, 12:13 PM 02/01/2022

## 2022-02-01 NOTE — Telephone Encounter (Signed)
Staff message received from [place].  "Urinalysis Received: Today Jean Gammon, NP  P [facility] Triage Please let Jean Burke know her urinalysis did show blood and calcium oxalate present indicating possible kidney stones. I see she has a history of them as well. Just something for her to keep an eye on and if she experiences severe pain or difficulty urinating she needs to go to ER. She is going out of country on Friday. I will send Flomax for her to have on hand during her trip. She should only take if she experiences pain. This will help her to pass it. She should still see someone there as well. Flomax can cause a decrease in blood pressure, so just something to be aware of. "    I spoke with patient and informed her.

## 2022-02-03 LAB — URINE CULTURE
MICRO NUMBER:: 14092924
Result:: NO GROWTH
SPECIMEN QUALITY:: ADEQUATE

## 2022-02-03 LAB — URINALYSIS, COMPLETE W/RFL CULTURE
Bilirubin Urine: NEGATIVE
Casts: NONE SEEN /LPF
Glucose, UA: NEGATIVE
Hyaline Cast: NONE SEEN /LPF
Leukocyte Esterase: NEGATIVE
Nitrites, Initial: NEGATIVE
Specific Gravity, Urine: 1.025 (ref 1.001–1.035)
pH: 5.5 (ref 5.0–8.0)

## 2022-02-03 LAB — CULTURE INDICATED

## 2022-02-21 ENCOUNTER — Encounter: Payer: Self-pay | Admitting: Obstetrics and Gynecology

## 2022-02-21 ENCOUNTER — Ambulatory Visit (INDEPENDENT_AMBULATORY_CARE_PROVIDER_SITE_OTHER): Payer: BC Managed Care – PPO | Admitting: Obstetrics and Gynecology

## 2022-02-21 VITALS — BP 118/78 | HR 78 | Ht 68.0 in | Wt 174.0 lb

## 2022-02-21 DIAGNOSIS — R300 Dysuria: Secondary | ICD-10-CM | POA: Diagnosis not present

## 2022-02-21 DIAGNOSIS — N898 Other specified noninflammatory disorders of vagina: Secondary | ICD-10-CM | POA: Diagnosis not present

## 2022-02-21 LAB — WET PREP FOR TRICH, YEAST, CLUE

## 2022-02-21 NOTE — Progress Notes (Signed)
GYNECOLOGY  VISIT   HPI: 31 y.o.   Single  Caucasian  female   G0P0 with Patient's last menstrual period was 02/19/2022 (exact date).   here for dysuria  Treated here for vulvovaginal candidiasis 02/01/22.  A urine culture was negative for infection but her urine showed 3+ hgb and > 60 RBCs.   She traveled shortly after her office visit.  She self treated for yeast medication in [place].    No vaginal discharge currently, but she feels irritation.   She is noticing dysuria at the end of voiding.   Her LMP came early, 02/19/22.  Prior to that it was 02/03/22. She missed a pill around 02/17/22.  She took 2 the following day.   Sexually active.  Using condoms.  No partner change.   She thinks she passed a kidney stone Oct. 3 - 7. No formal diagnosis.  Pain resolved.   GYNECOLOGIC HISTORY: Patient's last menstrual period was 02/19/2022 (exact date). Contraception:  Nikki Menopausal hormone therapy:  n/a Last mammogram:  n/a Last pap smear:   10-22-18 Neg:Neg HR HPV, 08-15-16 neg no endocervical cells, endometrial cells present, 06-27-14 Neg         OB History     Gravida  0   Para  0   Term  0   Preterm  0   AB  0   Living  0      SAB  0   IAB  0   Ectopic  0   Multiple  0   Live Births  0              Patient Active Problem List   Diagnosis Date Noted   Chronic right-sided low back pain without sciatica 04/20/2016    Past Medical History:  Diagnosis Date   H/O renal calculi Jul 20, 2009   right side and passed on her own    Past Surgical History:  Procedure Laterality Date   dental implants  02/2019, 06/2019   2 implants    Current Outpatient Medications  Medication Sig Dispense Refill   drospirenone-ethinyl estradiol (NIKKI) 3-0.02 MG tablet Take 1 tablet by mouth daily. 84 tablet 3   Multiple Vitamin (MULTIVITAMIN) tablet Take 1 tablet by mouth.      Probiotic Product (PROBIOTIC-10 PO) Take 1 tablet by mouth daily.     tamsulosin (FLOMAX) 0.4  MG CAPS capsule Take 1 capsule (0.4 mg total) by mouth daily after breakfast. (Patient not taking: Reported on 02/21/2022) 10 capsule 0   No current facility-administered medications for this visit.     ALLERGIES: Patient has no known allergies.  Family History  Problem Relation Age of Onset   Cancer Paternal Grandmother        thyroid   Hyperlipidemia Father    Brain cancer Maternal Aunt     Social History   Socioeconomic History   Marital status: Single    Spouse name: Not on file   Number of children: 0   Years of education: Not on file   Highest education level: Not on file  Occupational History   Not on file  Tobacco Use   Smoking status: Never    Passive exposure: Never   Smokeless tobacco: Never  Vaping Use   Vaping Use: Never used  Substance and Sexual Activity   Alcohol use: Yes    Comment: 1 drink/month   Drug use: No   Sexual activity: Yes    Partners: Male    Birth control/protection: Pill,  Condom  Other Topics Concern   Not on file  Social History Narrative   Not on file   Social Determinants of Health   Financial Resource Strain: Not on file  Food Insecurity: Not on file  Transportation Needs: Not on file  Physical Activity: Not on file  Stress: Not on file  Social Connections: Not on file  Intimate Partner Violence: Not on file    Review of Systems  Genitourinary:  Positive for dysuria.    PHYSICAL EXAMINATION:    BP 118/78 (BP Location: Right Arm, Patient Position: Sitting, Cuff Size: Normal)   Pulse 78   Ht 5\' 8"  (1.727 m)   Wt 174 lb (78.9 kg)   LMP 02/19/2022 (Exact Date)   BMI 26.46 kg/m     General appearance: alert, cooperative and appears stated age   Pelvic: External genitalia:  no lesions              Urethra:  normal appearing urethra with no masses, tenderness or lesions              Bartholins and Skenes: normal                 Vagina: normal appearing vagina with normal color and discharge, no lesions               Cervix: no lesions.  Minor red/brown blood from os and in the vagina.                 Bimanual Exam:  Uterus:  normal size, contour, position, consistency, mobility, non-tender              Adnexa: no mass, fullness, tenderness    Chaperone was present for exam:  Kimalexis.   ASSESSMENT  Vaginal irritation.  Dysuria.  Current vaginal bleeding.   PLAN  Wet prep: negative yeast, clue cells and trichomonas.  Urinalysis sg 1.010, ph 5.5, neg nitrites, 0 - 5 WBC, 3 - 10 RBC, 0 - 5 squams, few bacteria.  UC sent.  Patient will wait for results.  No abx for UTI today.    An After Visit Summary was printed and given to the patient.  21 min  total time was spent for this patient encounter, including preparation, face-to-face counseling with the patient, coordination of care, and documentation of the encounter.

## 2022-02-23 LAB — URINALYSIS, COMPLETE W/RFL CULTURE
Bilirubin Urine: NEGATIVE
Glucose, UA: NEGATIVE
Hyaline Cast: NONE SEEN /LPF
Ketones, ur: NEGATIVE
Leukocyte Esterase: NEGATIVE
Nitrites, Initial: NEGATIVE
Protein, ur: NEGATIVE
Specific Gravity, Urine: 1.01 (ref 1.001–1.035)
pH: 5.5 (ref 5.0–8.0)

## 2022-02-23 LAB — CULTURE INDICATED

## 2022-02-23 LAB — URINE CULTURE
MICRO NUMBER:: 14180100
SPECIMEN QUALITY:: ADEQUATE

## 2022-08-23 DIAGNOSIS — L821 Other seborrheic keratosis: Secondary | ICD-10-CM | POA: Diagnosis not present

## 2022-08-23 DIAGNOSIS — L578 Other skin changes due to chronic exposure to nonionizing radiation: Secondary | ICD-10-CM | POA: Diagnosis not present

## 2022-08-23 DIAGNOSIS — D1801 Hemangioma of skin and subcutaneous tissue: Secondary | ICD-10-CM | POA: Diagnosis not present

## 2022-08-23 DIAGNOSIS — L814 Other melanin hyperpigmentation: Secondary | ICD-10-CM | POA: Diagnosis not present

## 2022-11-29 NOTE — Progress Notes (Signed)
32 y.o. G0P0000 Single Caucasian female here for annual exam.    Doing ok with her birth control.  Declines STD screening.   Maternal grandmother died of cancer.  Uncertain which type.   Going to the [place] with her mother.   PCP:   None  Patient's last menstrual period was 12/08/2022.     Period Cycle (Days): 28 Period Duration (Days): 3-4 Period Pattern: Regular Menstrual Flow: Light Menstrual Control: Tampon Dysmenorrhea: (!) Mild     Sexually active: No.  The current method of family planning is OCP (estrogen/progesterone).    Exercising: Yes.     gym Smoker:  no  Health Maintenance: Pap:  10/22/18 neg: HR HPV neg, 08/15/16 neg History of abnormal Pap:  no MMG:  n/a Colonoscopy:  n/a BMD:   n/a  Result  n/a TDaP:  05/04/17 Gardasil:   yes HIV: n/a Hep C: n/a Screening Labs:  PCP   reports that she has never smoked. She has never been exposed to tobacco smoke. She has never used smokeless tobacco. She reports current alcohol use. She reports that she does not use drugs.  Past Medical History:  Diagnosis Date   H/O renal calculi Jan 10, 2010   right side and passed on her own    Past Surgical History:  Procedure Laterality Date   dental implants  02/2019, 06/2019   2 implants    Current Outpatient Medications  Medication Sig Dispense Refill   drospirenone-ethinyl estradiol (NIKKI) 3-0.02 MG tablet Take 1 tablet by mouth daily. 84 tablet 3   Multiple Vitamin (MULTIVITAMIN) tablet Take 1 tablet by mouth.      tamsulosin (FLOMAX) 0.4 MG CAPS capsule Take 1 capsule (0.4 mg total) by mouth daily after breakfast. (Patient not taking: Reported on 02/21/2022) 10 capsule 0   No current facility-administered medications for this visit.    Family History  Problem Relation Age of Onset   Hyperlipidemia Father    Cancer Maternal Grandmother    Cancer Paternal Grandmother        thyroid   Brain cancer Maternal Aunt     Review of Systems  All other systems  reviewed and are negative.   Exam:   BP 122/72 (BP Location: Left Arm, Patient Position: Sitting, Cuff Size: Normal)   Pulse 77   Ht 5\' 9"  (1.753 m)   Wt 187 lb (84.8 kg)   LMP 12/08/2022   SpO2 98%   BMI 27.62 kg/m     General appearance: alert, cooperative and appears stated age Head: normocephalic, without obvious abnormality, atraumatic Neck: no adenopathy, supple, symmetrical, trachea midline and thyroid normal to inspection and palpation Lungs: clear to auscultation bilaterally Breasts: normal appearance, no masses or tenderness, No nipple retraction or dimpling, No nipple discharge or bleeding, No axillary adenopathy Heart: regular rate and rhythm Abdomen: soft, non-tender; no masses, no organomegaly Extremities: extremities normal, atraumatic, no cyanosis or edema Skin: skin color, texture, turgor normal. No rashes or lesions Lymph nodes: cervical, supraclavicular, and axillary nodes normal. Neurologic: grossly normal  Pelvic: External genitalia:  no lesions              No abnormal inguinal nodes palpated.              Urethra:  normal appearing urethra with no masses, tenderness or lesions              Bartholins and Skenes: normal  Vagina: normal appearing vagina with normal color and discharge, no lesions              Cervix: no lesions.  Minimal blood noted.               Pap taken: no Bimanual Exam:  Uterus:  normal size, contour, position, consistency, mobility, non-tender              Adnexa: no mass, fullness, tenderness              Chaperone was present for exam:  Warren Lacy, CMA  Assessment:   Well woman visit with gynecologic exam. On COCs.   Plan: Mammogram screening age 53.  Self breast awareness reviewed. Pap and HR HPV 2025.  Guidelines for Calcium, Vitamin D, regular exercise program including cardiovascular and weight bearing exercise. Refill of COCs for one year.   We reviewed Covid and flu vaccines.  Follow up annually and prn.

## 2022-12-13 ENCOUNTER — Ambulatory Visit (INDEPENDENT_AMBULATORY_CARE_PROVIDER_SITE_OTHER): Payer: BC Managed Care – PPO | Admitting: Obstetrics and Gynecology

## 2022-12-13 ENCOUNTER — Encounter: Payer: Self-pay | Admitting: Obstetrics and Gynecology

## 2022-12-13 VITALS — BP 122/72 | HR 77 | Ht 69.0 in | Wt 187.0 lb

## 2022-12-13 DIAGNOSIS — Z01419 Encounter for gynecological examination (general) (routine) without abnormal findings: Secondary | ICD-10-CM | POA: Diagnosis not present

## 2022-12-13 DIAGNOSIS — Z3041 Encounter for surveillance of contraceptive pills: Secondary | ICD-10-CM

## 2022-12-13 MED ORDER — DROSPIRENONE-ETHINYL ESTRADIOL 3-0.02 MG PO TABS: 1.0000 | ORAL_TABLET | Freq: Every day | ORAL | 3 refills | Status: DC

## 2022-12-13 NOTE — Patient Instructions (Signed)

## 2023-08-23 DIAGNOSIS — D229 Melanocytic nevi, unspecified: Secondary | ICD-10-CM | POA: Diagnosis not present

## 2023-08-23 DIAGNOSIS — L814 Other melanin hyperpigmentation: Secondary | ICD-10-CM | POA: Diagnosis not present

## 2023-08-23 DIAGNOSIS — L578 Other skin changes due to chronic exposure to nonionizing radiation: Secondary | ICD-10-CM | POA: Diagnosis not present

## 2023-08-23 DIAGNOSIS — L821 Other seborrheic keratosis: Secondary | ICD-10-CM | POA: Diagnosis not present

## 2023-12-03 ENCOUNTER — Other Ambulatory Visit: Payer: Self-pay | Admitting: Obstetrics and Gynecology

## 2023-12-03 DIAGNOSIS — Z3041 Encounter for surveillance of contraceptive pills: Secondary | ICD-10-CM

## 2023-12-04 NOTE — Telephone Encounter (Signed)
 Med refill request:  Drospirenone -ethinyl estradiol  (Nikki ) 3-0.02 mg tablet Last filled: 09/09/23 - #84 tablets with 3 refills Last AEX:  12/13/22 Next AEX: 12/19/23 Refill authorized? Please advise.

## 2023-12-08 DIAGNOSIS — Z6828 Body mass index (BMI) 28.0-28.9, adult: Secondary | ICD-10-CM | POA: Diagnosis not present

## 2023-12-08 DIAGNOSIS — Z0000 Encounter for general adult medical examination without abnormal findings: Secondary | ICD-10-CM | POA: Diagnosis not present

## 2023-12-08 DIAGNOSIS — R030 Elevated blood-pressure reading, without diagnosis of hypertension: Secondary | ICD-10-CM | POA: Diagnosis not present

## 2023-12-15 NOTE — Progress Notes (Signed)
 33 y.o. G0P0000 Single Caucasian female here for annual exam.    Wants to continue her birth control pills.   Would like vaginal/cervical STD screening.   Declines serum testing today.  Sees dermatology yearly.   Wants Covid vaccine.  She will do her flu vaccine at work.   Going to [place].  Does a lot of world traveling with her mom.  PCP: Pcp, No   Patient's last menstrual period was 12/03/2023 (exact date).     Period Cycle (Days): 28 Period Duration (Days): 3 Period Pattern: Regular Menstrual Flow: Light Menstrual Control: Tampon Dysmenorrhea: None     Sexually active: No.  The current method of family planning is OCP (estrogen/progesterone).    Menopausal hormone therapy:  n/a Exercising: Yes.    Gym 5-7x week, walking, hiking  Smoker:  no  OB History  Gravida Para Term Preterm AB Living  0 0 0 0 0 0  SAB IAB Ectopic Multiple Live Births  0 0 0 0 0     HEALTH MAINTENANCE: Last 2 paps:  10/22/18 neg HPV neg, 08/15/16 neg  History of abnormal Pap or positive HPV:  no Mammogram:   n/a Colonoscopy:  n/a Bone Density:  n/a  Result  n/a   Immunization History  Administered Date(s) Administered   Tdap 05/04/2017      reports that she has never smoked. She has never been exposed to tobacco smoke. She has never used smokeless tobacco. She reports current alcohol use. She reports that she does not use drugs.  Past Medical History:  Diagnosis Date   H/O renal calculi 2010-01-13   right side and passed on her own    Past Surgical History:  Procedure Laterality Date   dental implants  02/2019, 06/2019   2 implants    Current Outpatient Medications  Medication Sig Dispense Refill   CALCIUM PO Take by mouth.     drospirenone -ethinyl estradiol  (NIKKI ) 3-0.02 MG tablet TAKE 1 TABLET BY MOUTH EVERY DAY 28 tablet 0   Multiple Vitamin (MULTIVITAMIN) tablet Take 1 tablet by mouth.      Probiotic Product (PROBIOTIC PO) Take by mouth.     tretinoin (RETIN-A) 0.025 %  cream Mix pea size amount w/ moisturizer and apply all over the face Externally Start using every other night for 7 days then increase to nightly as tolerable; Duration: 30 days     tamsulosin  (FLOMAX ) 0.4 MG CAPS capsule Take 1 capsule (0.4 mg total) by mouth daily after breakfast. (Patient not taking: Reported on 12/19/2023) 10 capsule 0   No current facility-administered medications for this visit.    ALLERGIES: Patient has no known allergies.  Family History  Problem Relation Age of Onset   Hyperlipidemia Father    Cancer Maternal Grandmother    Cancer Paternal Grandmother        thyroid   Brain cancer Maternal Aunt     Review of Systems  All other systems reviewed and are negative.   PHYSICAL EXAM:  BP 124/82 (BP Location: Left Arm, Patient Position: Sitting)   Pulse 90   Ht 5' 10 (1.778 m)   Wt 178 lb (80.7 kg)   LMP 12/03/2023 (Exact Date)   SpO2 99%   BMI 25.54 kg/m     General appearance: alert, cooperative and appears stated age Head: normocephalic, without obvious abnormality, atraumatic Neck: no adenopathy, supple, symmetrical, trachea midline and thyroid normal to inspection and palpation Lungs: clear to auscultation bilaterally Breasts: normal appearance, no masses or  tenderness, No nipple retraction or dimpling, No nipple discharge or bleeding, No axillary adenopathy Heart: regular rate and rhythm Abdomen: soft, non-tender; no masses, no organomegaly Extremities: extremities normal, atraumatic, no cyanosis or edema Skin: skin color, texture, turgor normal. No rashes or lesions Lymph nodes: cervical, supraclavicular, and axillary nodes normal. Neurologic: grossly normal  Pelvic: External genitalia:  no lesions.  Scattered moderately pigmented flat nevi of the lower vulva/peri-anal region.               No abnormal inguinal nodes palpated.              Urethra:  normal appearing urethra with no masses, tenderness or lesions              Bartholins and Skenes:  normal                 Vagina: normal appearing vagina with normal color and discharge, no lesions              Cervix: no lesions              Pap taken: yes Bimanual Exam:  Uterus:  normal size, contour, position, consistency, mobility, non-tender              Adnexa: no mass, fullness, tenderness               Chaperone was present for exam:  Dereck BROCKS, CMA  ASSESSMENT: Well woman visit with gynecologic exam. Surveillance of COCs.  Desire for Covid vaccination.  PHQ-2-9: 0  PLAN: Mammogram screening discussed. Self breast awareness reviewed. Pap and HRV collected:  yes.  STD screening for GC/CT/trichomonas. Guidelines for Calcium, Vitamin D, regular exercise program including cardiovascular and weight bearing exercise. Medication refills:  Nikki  COCs, 3 packs, 3 refills.  Covid vaccination ordered, Spikevax. Follow up:  yearly and prn.

## 2023-12-19 ENCOUNTER — Ambulatory Visit (INDEPENDENT_AMBULATORY_CARE_PROVIDER_SITE_OTHER): Payer: BC Managed Care – PPO | Admitting: Obstetrics and Gynecology

## 2023-12-19 ENCOUNTER — Other Ambulatory Visit (HOSPITAL_COMMUNITY)
Admission: RE | Admit: 2023-12-19 | Discharge: 2023-12-19 | Disposition: A | Discharge status: Home / Self Care | Source: Ambulatory Visit | Attending: Obstetrics and Gynecology | Admitting: Obstetrics and Gynecology

## 2023-12-19 ENCOUNTER — Encounter: Payer: Self-pay | Admitting: Obstetrics and Gynecology

## 2023-12-19 VITALS — BP 124/82 | HR 90 | Ht 70.0 in | Wt 178.0 lb

## 2023-12-19 DIAGNOSIS — Z113 Encounter for screening for infections with a predominantly sexual mode of transmission: Secondary | ICD-10-CM | POA: Diagnosis not present

## 2023-12-19 DIAGNOSIS — Z01419 Encounter for gynecological examination (general) (routine) without abnormal findings: Secondary | ICD-10-CM | POA: Diagnosis not present

## 2023-12-19 DIAGNOSIS — Z124 Encounter for screening for malignant neoplasm of cervix: Secondary | ICD-10-CM | POA: Insufficient documentation

## 2023-12-19 DIAGNOSIS — Z3041 Encounter for surveillance of contraceptive pills: Secondary | ICD-10-CM

## 2023-12-19 DIAGNOSIS — Z1331 Encounter for screening for depression: Secondary | ICD-10-CM

## 2023-12-19 MED ORDER — COVID-19 MRNA VACC (MODERNA) 50 MCG/0.5ML IM SUSY: 0.5000 mL | PREFILLED_SYRINGE | Freq: Once | INTRAMUSCULAR | 0 refills | Status: AC

## 2023-12-19 MED ORDER — DROSPIRENONE-ETHINYL ESTRADIOL 3-0.02 MG PO TABS: 1.0000 | ORAL_TABLET | Freq: Every day | ORAL | 3 refills | Status: AC

## 2023-12-19 NOTE — Patient Instructions (Signed)

## 2023-12-20 ENCOUNTER — Ambulatory Visit: Payer: Self-pay | Admitting: Obstetrics and Gynecology

## 2023-12-20 LAB — CYTOLOGY - PAP
Chlamydia: NEGATIVE
Comment: NEGATIVE
Comment: NEGATIVE
Comment: NEGATIVE
Comment: NORMAL
Diagnosis: NEGATIVE
High risk HPV: NEGATIVE
Neisseria Gonorrhea: NEGATIVE
Trichomonas: NEGATIVE

## 2024-02-02 DIAGNOSIS — H6123 Impacted cerumen, bilateral: Secondary | ICD-10-CM | POA: Diagnosis not present

## 2024-02-29 ENCOUNTER — Encounter: Payer: Self-pay | Admitting: Obstetrics and Gynecology

## 2024-02-29 ENCOUNTER — Ambulatory Visit (INDEPENDENT_AMBULATORY_CARE_PROVIDER_SITE_OTHER): Admitting: Obstetrics and Gynecology

## 2024-02-29 VITALS — BP 120/76 | HR 92

## 2024-02-29 DIAGNOSIS — Z308 Encounter for other contraceptive management: Secondary | ICD-10-CM

## 2024-02-29 DIAGNOSIS — Z3041 Encounter for surveillance of contraceptive pills: Secondary | ICD-10-CM

## 2024-02-29 NOTE — Progress Notes (Signed)
 GYNECOLOGY  VISIT   HPI: 33 y.o.   Single  Caucasian female   G0P0000 with Patient's last menstrual period was 02/17/2024 (exact date).   here for: IUD Consultation.     Needs pregnancy prevention.      On the pill for about 15 - 18 years.  No period concerns.    Does not like needles.   Went to [place].    GYNECOLOGIC HISTORY: Patient's last menstrual period was 02/17/2024 (exact date). Contraception:  OCP Menopausal hormone therapy:  n/a Last 2 paps:  12/19/23 neg HR HPV neg, 10/22/18 neg HPV neg History of abnormal Pap or positive HPV:  no Mammogram:  n/a        OB History     Gravida  0   Para  0   Term  0   Preterm  0   AB  0   Living  0      SAB  0   IAB  0   Ectopic  0   Multiple  0   Live Births  0              Patient Active Problem List   Diagnosis Date Noted   Chronic right-sided low back pain without sciatica 04/20/2016    Past Medical History:  Diagnosis Date   H/O renal calculi 2010/03/31   right side and passed on her own    Past Surgical History:  Procedure Laterality Date   dental implants  02/2019, 06/2019   2 implants    Current Outpatient Medications  Medication Sig Dispense Refill   CALCIUM PO Take by mouth.     drospirenone -ethinyl estradiol  (NIKKI ) 3-0.02 MG tablet Take 1 tablet by mouth daily. 84 tablet 3   Multiple Vitamin (MULTIVITAMIN) tablet Take 1 tablet by mouth.      Probiotic Product (PROBIOTIC PO) Take by mouth.     No current facility-administered medications for this visit.     ALLERGIES: Patient has no known allergies.  Family History  Problem Relation Age of Onset   Hyperlipidemia Father    Cancer Maternal Grandmother    Cancer Paternal Grandmother        thyroid   Brain cancer Maternal Aunt     Social History   Socioeconomic History   Marital status: Single    Spouse name: Not on file   Number of children: 0   Years of education: Not on file   Highest education level: Not on file   Occupational History   Not on file  Tobacco Use   Smoking status: Never    Passive exposure: Never   Smokeless tobacco: Never  Vaping Use   Vaping status: Never Used  Substance and Sexual Activity   Alcohol use: Yes    Comment: 1 drink/month   Drug use: No   Sexual activity: Yes    Partners: Male    Birth control/protection: Pill  Other Topics Concern   Not on file  Social History Narrative   Not on file   Social Drivers of Health   Financial Resource Strain: Not on file  Food Insecurity: Not on file  Transportation Needs: Not on file  Physical Activity: Not on file  Stress: Not on file  Social Connections: Not on file  Intimate Partner Violence: Not on file    Review of Systems  All other systems reviewed and are negative.   PHYSICAL EXAMINATION:   BP 120/76 (BP Location: Left Arm, Patient Position: Sitting)  Pulse 92   LMP 02/17/2024 (Exact Date)   SpO2 97%     General appearance: alert, cooperative and appears stated age  ASSESSMENT:  Contraceptive counseling.   PLAN:  Long acting contraceptive options reviewed:  IUDs, Depo Provera, Nexplanon.  Focus on IUDs - Paragard, Kyleena, and Mirena.  Brochures also given. Detailed discussion of risks and benefits of each IUD along with bleeding profiles, and noncontraceptive benefits of the progesterone IUDs.  Procedure for placement and removal of IUD reviewed.  Consider paracervical block and Cytotec .  We also discussed noncontraceptive benefits of combined birth control pills.  Condoms recommended.   Patient will consider her options.   Fu for annual and prn.   30 min  total time was spent for this patient encounter, including preparation, face-to-face counseling with the patient, coordination of care, and documentation of the encounter.

## 2024-03-04 ENCOUNTER — Other Ambulatory Visit: Payer: Self-pay | Admitting: Obstetrics and Gynecology

## 2024-03-04 ENCOUNTER — Telehealth: Payer: Self-pay | Admitting: Obstetrics and Gynecology

## 2024-03-04 DIAGNOSIS — Z308 Encounter for other contraceptive management: Secondary | ICD-10-CM

## 2024-03-04 MED ORDER — MISOPROSTOL 200 MCG PO TABS: ORAL_TABLET | ORAL | 0 refills | Status: AC

## 2024-03-04 NOTE — Telephone Encounter (Signed)
 Patient has IUD insertion in January.   Please contact patient with instructions for IUD placement.  I have placed an order for Cytotec .  She can also take Ibuprofen 800 mg prior to appointment.   Eat and hydrate prior to appointment.  Support person to be able to drive her home afterward.   Patient has stated that she does not like needles.

## 2024-03-04 NOTE — Progress Notes (Signed)
 Order for Cytotec  200 mcg pv at hs the night before and the am of insertion.

## 2024-03-05 NOTE — Telephone Encounter (Signed)
 Patient notified of instructions and understands.

## 2024-04-16 NOTE — Progress Notes (Signed)
 "  GYNECOLOGY  VISIT   HPI: 34 y.o.   Single  Caucasian female   G0P0000 with Patient's last menstrual period was 04/15/2024 (exact date).   here for: IUD Insertion - Mirena     Used Cytotec  last hs and this am.  Took Motrin 800 mg this am.   Prefers not to use local anesthesia.  UPT negative.     Neg GC/CT/trich on 12/19/23.   GYNECOLOGIC HISTORY: Patient's last menstrual period was 04/15/2024 (exact date). Contraception:  OCP Menopausal hormone therapy:  n/a Last 2 paps:   12/19/23 neg HR HPV neg, 10/22/18 neg HPV neg  History of abnormal Pap or positive HPV:  no Mammogram:  n/a        OB History     Gravida  0   Para  0   Term  0   Preterm  0   AB  0   Living  0      SAB  0   IAB  0   Ectopic  0   Multiple  0   Live Births  0              Patient Active Problem List   Diagnosis Date Noted   Chronic right-sided low back pain without sciatica 04/20/2016    Past Medical History:  Diagnosis Date   H/O renal calculi 28-May-2009   right side and passed on her own    Past Surgical History:  Procedure Laterality Date   dental implants  02/2019, 06/2019   2 implants    Current Outpatient Medications  Medication Sig Dispense Refill   CALCIUM PO Take by mouth.     drospirenone -ethinyl estradiol  (NIKKI ) 3-0.02 MG tablet Take 1 tablet by mouth daily. 84 tablet 3   misoprostol  (CYTOTEC ) 200 MCG tablet Place tablet (200 mcg) per vagina the night before IUD insertion and then place one tablet per vagina the morning of IUD insertion. 2 tablet 0   Multiple Vitamin (MULTIVITAMIN) tablet Take 1 tablet by mouth.      Probiotic Product (PROBIOTIC PO) Take by mouth.     No current facility-administered medications for this visit.     ALLERGIES: Patient has no known allergies.  Family History  Problem Relation Age of Onset   Hyperlipidemia Father    Cancer Maternal Grandmother    Cancer Paternal Grandmother        thyroid   Brain cancer Maternal Aunt      Social History   Socioeconomic History   Marital status: Single    Spouse name: Not on file   Number of children: 0   Years of education: Not on file   Highest education level: Not on file  Occupational History   Not on file  Tobacco Use   Smoking status: Never    Passive exposure: Never   Smokeless tobacco: Never  Vaping Use   Vaping status: Never Used  Substance and Sexual Activity   Alcohol use: Yes    Comment: 1 drink/month   Drug use: No   Sexual activity: Yes    Partners: Male    Birth control/protection: Pill  Other Topics Concern   Not on file  Social History Narrative   Not on file   Social Drivers of Health   Tobacco Use: Low Risk (04/18/2024)   Patient History    Smoking Tobacco Use: Never    Smokeless Tobacco Use: Never    Passive Exposure: Never  Financial Resource Strain: Not on file  Food Insecurity: Not on file  Transportation Needs: Not on file  Physical Activity: Not on file  Stress: Not on file  Social Connections: Not on file  Intimate Partner Violence: Not on file  Depression (PHQ2-9): Low Risk (12/19/2023)   Depression (PHQ2-9)    PHQ-2 Score: 0  Alcohol Screen: Not on file  Housing: Not on file  Utilities: Not on file  Health Literacy: Not on file    Review of Systems  All other systems reviewed and are negative.   PHYSICAL EXAMINATION:   BP 126/84 (BP Location: Left Arm, Patient Position: Sitting)   Pulse (!) 101   LMP 04/15/2024 (Exact Date)   SpO2 97%     General appearance: alert, cooperative and appears stated age  Pelvic: External genitalia:  no lesions              Urethra:  normal appearing urethra with no masses, tenderness or lesions              Bartholins and Skenes: normal                 Vagina: normal appearing vagina with normal color and discharge, no lesions              Cervix: no lesions.  Menstrual flow.                Bimanual Exam:  Uterus:  normal size, contour, position, consistency, mobility,  non-tender              Adnexa: no mass, fullness, tenderness   Consent for  Mirena  IUD insertion.  Lot  [id number]     , expiration  October 2027. Time out done. Speculum placed in vagina.  Sterile prep of cervix with Hibiclens.  Cytotec  removed. Tenaculum to anterior cervical lip.  Uterus sounded to  just over 6.5 cm.     Mirena  IUD placed without difficulty.  Strings trimmed and shown to patient.  Repeat bimanual exam, no change. No complications.  Minimal EBL.  Chaperone was present for exam:  Kari HERO, CMA  ASSESSMENT:  Mirena  IUD insertion.   PLAN:  IUD card to patient.  Post IUD insertion precautions given. Abstain for one week.  Ok to stop COCs.  FU in 4 weeks.        "

## 2024-04-18 ENCOUNTER — Encounter: Payer: Self-pay | Admitting: Obstetrics and Gynecology

## 2024-04-18 ENCOUNTER — Ambulatory Visit (INDEPENDENT_AMBULATORY_CARE_PROVIDER_SITE_OTHER): Admitting: Obstetrics and Gynecology

## 2024-04-18 VITALS — BP 126/84 | HR 101

## 2024-04-18 DIAGNOSIS — Z308 Encounter for other contraceptive management: Secondary | ICD-10-CM

## 2024-04-18 DIAGNOSIS — Z30430 Encounter for insertion of intrauterine contraceptive device: Secondary | ICD-10-CM

## 2024-04-18 DIAGNOSIS — Z01812 Encounter for preprocedural laboratory examination: Secondary | ICD-10-CM

## 2024-04-18 LAB — PREGNANCY, URINE: Preg Test, Ur: NEGATIVE

## 2024-04-18 NOTE — Patient Instructions (Signed)
 Intrauterine Device (IUD) Insertion: What to Expect  An intrauterine device (IUD) is put in (inserted) your uterus to prevent pregnancy. It's a small, T-shaped device that has one or two nylon strings hanging down from it. The strings hang out of your cervix, which is the lowest part of your uterus. Tell a health care provider about: Any allergies you have. All medicines you take. These include vitamins, herbs, eye drops, and creams. Any surgeries you have had. Any medical problems you have. Whether you're pregnant or may be pregnant. What are the risks? Your health care provider will talk with you about risks. These may include: Infection. Bleeding. Allergic reactions to medicines. A cut to the uterus, also called perforation, or damage to other structures or organs. Accidental placement of the IUD either in the muscle layer of the uterus or outside the uterus. The IUD falling out of the uterus. This is more common if you recently had a baby. Higher risk of ectopic pregnancy. This is when an egg is fertilized outside your uterus. This is rare. Pelvic inflammatory disease (PID). This is an infection in the uterus and fallopian tubes. The IUD doesn't cause the infection. The infection is usually from a sexually transmitted infection (STI). If this happens, it is usually during the first 20 days after the IUD is put in. This is rare. What happens before? Ask about changing or stopping: Any medicines you take. Any vitamins, herbs, or supplements you take. Your provider may tell you to take pain medicines you can buy at the store before the procedure. You may have tests for: Pregnancy. You may have a pee (urine) or blood sample taken. STIs. Placing an IUD can make an infection worse. To check for cervical cancer. You may have a Pap test, which is when cells from your cervix are removed for testing. What happens during an IUD insertion? A tool, called a speculum, will be placed in your  vagina and widened so that your provider can see your cervix. A medicine to clean your cervix may be used to help lower your risk of infection. You may be given medicine to numb your cervix. This medicine is usually given by an injection into your cervix. A tool will be put into your uterus to check the length of your uterus. A thin tube that holds the IUD will be put into your vagina, through the opening of your cervix, and into your uterus. The IUD will be placed in your uterus. The tube that holds the IUD will be removed. The strings that are attached to the IUD will be trimmed so that they sit just outside your cervix. The speculum will be removed. These steps may vary. Ask what you can expect. What happens after? You may have: Bleeding. It can vary from light bleeding or spotting for a few days to period-like bleeding. This is normal. Cramps and pain in your belly. Dizziness or light-headedness. Pain in your lower back. Headaches and the feeling like you may throw up Follow these instructions at home: Do not have sex or put anything into your vagina for 24 hours after the IUD is placed. Before having sex, check to make sure that you can feel the IUD string or strings. You should be able to feel the end of the string below the opening of your cervix. If your IUD string is in place, you may continue with sex. If you had a hormonal IUD put in more than 7 days after your most recent period  started, you will need to use a backup method of birth control for 7 days after the IUD was placed. Hormones are chemicals that affect how the body works. Check that the IUD is still in place by feeling for the strings after every period, or check once a month. Use a condom every time you have sex to prevent STIs. An IUD won't protect you from STIs. Take your medicines only as told. Contact a health care provider if: You have any of the following problems with your IUD string or strings: The string  bothers or hurts you or your sexual partner. You can't feel the string. The string has gotten longer. The IUD comes out or you can feel the IUD in your vagina. You think you may be pregnant, or you miss your period. You think you may have an STI. You have bad-smelling discharge from your vagina. You have a fever and chills. You have pain during sex. Get help right away if: You have heavy bleeding, which means soaking more than 2 pads per hour for 2 hours in a row. You have sudden, really bad belly pain. This information is not intended to replace advice given to you by your health care provider. Make sure you discuss any questions you have with your health care provider. Document Revised: 12/05/2022 Document Reviewed: 12/05/2022 Elsevier Patient Education  2024 [employer].

## 2024-05-16 ENCOUNTER — Encounter: Payer: Self-pay | Admitting: Obstetrics and Gynecology

## 2024-05-16 NOTE — Progress Notes (Unsigned)
 "  GYNECOLOGY  VISIT   HPI: 34 y.o.   Single  Caucasian female   G0P0000 with No LMP recorded.   here for: IUD Check - Mirena       GYNECOLOGIC HISTORY: No LMP recorded. Contraception:  Mirena  IUD Menopausal hormone therapy:  n/a Last 2 paps:  12/19/23 neg HR HPV neg, 10/22/18 neg HPV neg  History of abnormal Pap or positive HPV:  no Mammogram:  n/a        OB History     Gravida  0   Para  0   Term  0   Preterm  0   AB  0   Living  0      SAB  0   IAB  0   Ectopic  0   Multiple  0   Live Births  0              Patient Active Problem List   Diagnosis Date Noted   Chronic right-sided low back pain without sciatica 04/20/2016    Past Medical History:  Diagnosis Date   H/O renal calculi 06-01-09   right side and passed on her own    Past Surgical History:  Procedure Laterality Date   dental implants  02/2019, 06/2019   2 implants    Current Outpatient Medications  Medication Sig Dispense Refill   CALCIUM PO Take by mouth.     drospirenone -ethinyl estradiol  (NIKKI ) 3-0.02 MG tablet Take 1 tablet by mouth daily. 84 tablet 3   misoprostol  (CYTOTEC ) 200 MCG tablet Place tablet (200 mcg) per vagina the night before IUD insertion and then place one tablet per vagina the morning of IUD insertion. 2 tablet 0   Multiple Vitamin (MULTIVITAMIN) tablet Take 1 tablet by mouth.      Probiotic Product (PROBIOTIC PO) Take by mouth.     No current facility-administered medications for this visit.     ALLERGIES: Patient has no known allergies.  Family History  Problem Relation Age of Onset   Hyperlipidemia Father    Cancer Maternal Grandmother    Cancer Paternal Grandmother        thyroid   Brain cancer Maternal Aunt     Social History   Socioeconomic History   Marital status: Single    Spouse name: Not on file   Number of children: 0   Years of education: Not on file   Highest education level: Not on file  Occupational History   Not on file  Tobacco  Use   Smoking status: Never    Passive exposure: Never   Smokeless tobacco: Never  Vaping Use   Vaping status: Never Used  Substance and Sexual Activity   Alcohol use: Yes    Comment: 1 drink/month   Drug use: No   Sexual activity: Yes    Partners: Male    Birth control/protection: Pill  Other Topics Concern   Not on file  Social History Narrative   Not on file   Social Drivers of Health   Tobacco Use: Low Risk (04/18/2024)   Patient History    Smoking Tobacco Use: Never    Smokeless Tobacco Use: Never    Passive Exposure: Never  Financial Resource Strain: Not on file  Food Insecurity: Not on file  Transportation Needs: Not on file  Physical Activity: Not on file  Stress: Not on file  Social Connections: Not on file  Intimate Partner Violence: Not on file  Depression (PHQ2-9): Low Risk (12/19/2023)  Depression (PHQ2-9)    PHQ-2 Score: 0  Alcohol Screen: Not on file  Housing: Not on file  Utilities: Not on file  Health Literacy: Not on file    Review of Systems  PHYSICAL EXAMINATION:   There were no vitals taken for this visit.    General appearance: alert, cooperative and appears stated age Head: Normocephalic, without obvious abnormality, atraumatic Neck: no adenopathy, supple, symmetrical, trachea midline and thyroid normal to inspection and palpation Lungs: clear to auscultation bilaterally Breasts: normal appearance, no masses or tenderness, No nipple retraction or dimpling, No nipple discharge or bleeding, No axillary or supraclavicular adenopathy Heart: regular rate and rhythm Abdomen: soft, non-tender, no masses,  no organomegaly Extremities: extremities normal, atraumatic, no cyanosis or edema Skin: Skin color, texture, turgor normal. No rashes or lesions Lymph nodes: Cervical, supraclavicular, and axillary nodes normal. No abnormal inguinal nodes palpated Neurologic: Grossly normal  Pelvic: External genitalia:  no lesions              Urethra:  normal  appearing urethra with no masses, tenderness or lesions              Bartholins and Skenes: normal                 Vagina: normal appearing vagina with normal color and discharge, no lesions              Cervix: no lesions                Bimanual Exam:  Uterus:  normal size, contour, position, consistency, mobility, non-tender              Adnexa: no mass, fullness, tenderness              Rectal exam: {yes no:314532}.  Confirms.              Anus:  normal sphincter tone, no lesions  Chaperone was present for exam:  {BSCHAPERONE:31226::Emily F, CMA}  ASSESSMENT:    PLAN:    {LABS (Optional):23779}  ***  total time was spent for this patient encounter, including preparation, face-to-face counseling with the patient, coordination of care, and documentation of the encounter.    "

## 2024-05-20 ENCOUNTER — Ambulatory Visit: Admitting: Obstetrics and Gynecology

## 2024-12-23 ENCOUNTER — Ambulatory Visit: Admitting: Obstetrics and Gynecology
